# Patient Record
Sex: Female | Born: 1995 | Hispanic: No | Marital: Single | Smoking: Never smoker
Health system: Southern US, Community
[De-identification: ages and names within clinical notes are randomized; demographics above are authoritative.]

## PROBLEM LIST (undated history)

## (undated) DIAGNOSIS — F32A Depression, unspecified: Secondary | ICD-10-CM

## (undated) DIAGNOSIS — F419 Anxiety disorder, unspecified: Secondary | ICD-10-CM

---
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
## 2018-07-22 ENCOUNTER — Other Ambulatory Visit: Payer: Self-pay

## 2018-07-22 ENCOUNTER — Ambulatory Visit (INDEPENDENT_AMBULATORY_CARE_PROVIDER_SITE_OTHER): Payer: BC Managed Care – PPO | Admitting: Family Medicine

## 2018-07-22 VITALS — BP 108/60 | HR 62

## 2018-07-22 DIAGNOSIS — Z3009 Encounter for other general counseling and advice on contraception: Secondary | ICD-10-CM

## 2018-07-22 DIAGNOSIS — Z3042 Encounter for surveillance of injectable contraceptive: Secondary | ICD-10-CM

## 2018-07-22 LAB — POCT URINE PREGNANCY: Preg Test, Ur: NEGATIVE

## 2018-07-22 MED ORDER — MEDROXYPROGESTERONE ACETATE 150 MG/ML IM SUSP
150.0000 mg | Freq: Once | INTRAMUSCULAR | Status: AC
  Administered 2018-07-22: 150 mg via INTRAMUSCULAR

## 2018-07-27 NOTE — Progress Notes (Signed)
GYNECOLOGY CLINIC PROCEDURE NOTE  Emily Sellers is a 23 y.o. No obstetric history on file. here for Nexplanon removal.  Patient has no Paps and will be rescheduling to get this done.  Pregnancy test this visit is negative.  She wants the Nexplanon out because she wants to go to the Depo shot.  No other gynecologic concerns.   Nexplanon Removal Patient identified, informed consent performed, consent signed.   Appropriate time out taken. Nexplanon site identified.  Area prepped in usual sterile fashon. One ml of 1% lidocaine was used to anesthetize the area at the distal end of the implant. A small stab incision was made right beside the implant on the distal portion.  The Nexplanon rod was grasped using hemostats and removed without difficulty.  There was minimal blood loss. There were no complications.  3 ml of 1% lidocaine was injected around the incision for post-procedure analgesia.  Steri-strips were applied over the small incision.  A pressure bandage was applied to reduce any bruising.  The patient tolerated the procedure well and was given post procedure instructions.  Patient is planning to use Depo for contraception/attempt conception and her first injection was given in the clinic before leaving.  She will reschedule for Pap.Sherene Sires, DO

## 2018-07-28 ENCOUNTER — Telehealth: Payer: Self-pay | Admitting: *Deleted

## 2018-07-28 NOTE — Telephone Encounter (Signed)
-----   Message from Sherene Sires, DO sent at 07/27/2018  8:11 PM EDT ----- Please schedule patient for pap, any available doc is fine

## 2018-07-28 NOTE — Telephone Encounter (Signed)
Contacted pt to see about scheduling this and she stated that she was going to have this done at her next depo injection.  That should be around the first part of October. April Zimmerman Rumple, CMA

## 2018-10-09 ENCOUNTER — Ambulatory Visit: Payer: BC Managed Care – PPO

## 2018-10-16 ENCOUNTER — Ambulatory Visit (INDEPENDENT_AMBULATORY_CARE_PROVIDER_SITE_OTHER): Payer: BC Managed Care – PPO

## 2018-10-16 ENCOUNTER — Other Ambulatory Visit: Payer: Self-pay

## 2018-10-16 DIAGNOSIS — Z3042 Encounter for surveillance of injectable contraceptive: Secondary | ICD-10-CM | POA: Diagnosis not present

## 2018-10-16 MED ORDER — MEDROXYPROGESTERONE ACETATE 150 MG/ML IM SUSP
150.0000 mg | Freq: Once | INTRAMUSCULAR | Status: AC
  Administered 2018-10-16: 150 mg via INTRAMUSCULAR

## 2018-10-16 NOTE — Progress Notes (Signed)
Patient presents in nurse clinic for depo provera injection. Patient is within dates. Injection given RUOQ, site unremarkable. Patient to return for next injection, 01/01/2019-01/15/2019. Reminder card given.

## 2019-01-04 ENCOUNTER — Ambulatory Visit
Admission: EM | Admit: 2019-01-04 | Discharge: 2019-01-04 | Disposition: A | Discharge status: Home / Self Care | Payer: BC Managed Care – PPO | Attending: Emergency Medicine | Admitting: Emergency Medicine

## 2019-01-04 ENCOUNTER — Encounter: Payer: Self-pay | Admitting: Emergency Medicine

## 2019-01-04 ENCOUNTER — Other Ambulatory Visit: Payer: Self-pay

## 2019-01-04 DIAGNOSIS — J029 Acute pharyngitis, unspecified: Secondary | ICD-10-CM | POA: Insufficient documentation

## 2019-01-04 DIAGNOSIS — Z20822 Contact with and (suspected) exposure to covid-19: Secondary | ICD-10-CM

## 2019-01-04 LAB — POCT RAPID STREP A (OFFICE): Rapid Strep A Screen: NEGATIVE

## 2019-01-04 MED ORDER — LIDOCAINE VISCOUS HCL 2 % MT SOLN: 15.0000 mL | OROMUCOSAL | 0 refills | Status: DC | PRN

## 2019-01-04 NOTE — ED Triage Notes (Addendum)
Pt presents to [facility] for assessment of sore throat x 2 days.  Denies fevers.  C/o nasal congestion and cough previously, but it has gone away since then.  Denies n/v. States cousin and aunt were treated for strep recently.  Pt refusing COVID testing at this time.

## 2019-01-04 NOTE — ED Provider Notes (Signed)
[name]    CSN: [id number] Arrival date & time: 01/04/19  1156      History   Chief Complaint Chief Complaint  Patient presents with  . Sore Throat    HPI Emily Sellers is a 24 y.o. female presenting for 2-day course of sore throat.  Denies fever, myalgias, arthralgias, cough, shortness of breath.  Patient states she recently got over a URI, there is no concern for Covid as is her family "all I tested before we get together for Christmas ".  No other family members are sick, and all family members test were negative.  Patient does state her cousin or recently treated for strep.  Patient has not had anything for symptoms which are worse in the morning, with swallowing.   History reviewed. No pertinent past medical history.  Patient Active Problem List   Diagnosis Date Noted  . Nexplanon in place 06/26/2016  . Contraceptive management 03/23/2016    History reviewed. No pertinent surgical history.  OB History   No obstetric history on file.      Home Medications    Prior to Admission medications   Medication Sig Start Date End Date Taking? Authorizing Provider  ARIPiprazole (ABILIFY) 10 MG tablet Take 10 mg by mouth daily.   Yes [provider]  sertraline (ZOLOFT) 50 MG tablet Take 50 mg by mouth daily.   Yes [provider]  ibuprofen (ADVIL,MOTRIN) 600 MG tablet Take 1 tablet (600 mg total) by mouth every 6 (six) hours as needed. 10/23/15   Cheri Fowler, PA-C  lidocaine (XYLOCAINE) 2 % solution Use as directed 15 mLs in the mouth or throat as needed for mouth pain. 01/04/19   Hall-Potvin, [place], PA-C    Family History Family History  Problem Relation Age of Onset  . Healthy Mother   . Healthy Father     Social History Social History   Tobacco Use  . Smoking status: Never Smoker  . Smokeless tobacco: Never Used  Substance Use Topics  . Alcohol use: No  . Drug use: No     Allergies   Patient has no known  allergies.   Review of Systems Review of Systems  Constitutional: Negative for fatigue and fever.  HENT: Positive for sore throat. Negative for congestion, dental problem, ear pain, facial swelling, hearing loss, sinus pain, trouble swallowing and voice change.   Eyes: Negative for photophobia, pain and visual disturbance.  Respiratory: Negative for cough and shortness of breath.   Cardiovascular: Negative for chest pain and palpitations.  Gastrointestinal: Negative for diarrhea and vomiting.  Musculoskeletal: Negative for arthralgias and myalgias.  Neurological: Negative for dizziness and headaches.     Physical Exam Triage Vital Signs ED Triage Vitals  Enc Vitals Group     BP      Pulse      Resp      Temp      Temp src      SpO2      Weight      Height      Head Circumference      Peak Flow      Pain Score      Pain Loc      Pain Edu?      Excl. in GC?    No data found.  Updated Vital Signs BP 126/83 (BP Location: Left Arm)   Pulse 96   Temp 98.9 F (37.2 C) (Temporal)   Resp 16   LMP 10/15/2018  SpO2 98%   Visual Acuity Right Eye Distance:   Left Eye Distance:   Bilateral Distance:    Right Eye Near:   Left Eye Near:    Bilateral Near:     Physical Exam Constitutional:      General: She is not in acute distress.    Appearance: She is well-developed and normal weight. She is not toxic-appearing or diaphoretic.  HENT:     Head: Normocephalic and atraumatic.     Jaw: There is normal jaw occlusion. No tenderness or pain on movement.     Right Ear: Hearing, tympanic membrane, ear canal and external ear normal. No tenderness. No mastoid tenderness.     Left Ear: Hearing, tympanic membrane, ear canal and external ear normal. No tenderness. No mastoid tenderness.     Nose: No nasal deformity, septal deviation or nasal tenderness.     Right Turbinates: Not swollen or pale.     Left Turbinates: Not swollen or pale.     Right Sinus: No maxillary sinus  tenderness or frontal sinus tenderness.     Left Sinus: No maxillary sinus tenderness or frontal sinus tenderness.     Mouth/Throat:     Lips: Pink. No lesions.     Mouth: Mucous membranes are moist. No injury.     Pharynx: Oropharynx is clear. Uvula midline. No posterior oropharyngeal erythema or uvula swelling.     Tonsils: Tonsillar exudate present. No tonsillar abscesses. 2+ on the right. 2+ on the left.  Eyes:     Conjunctiva/sclera: Conjunctivae normal.     Pupils: Pupils are equal, round, and reactive to light.  Neck:     Comments: Tender left-sided cervical lymphadenopathy Cardiovascular:     Rate and Rhythm: Normal rate and regular rhythm.     Heart sounds: No murmur. No gallop.   Pulmonary:     Effort: Pulmonary effort is normal. No respiratory distress.     Breath sounds: No wheezing.  Musculoskeletal:     Cervical back: Normal range of motion and neck supple. No muscular tenderness.  Lymphadenopathy:     Cervical: Cervical adenopathy present.  Skin:    General: Skin is warm.     Capillary Refill: Capillary refill takes less than 2 seconds.     Findings: No erythema or rash.  Neurological:     Mental Status: She is alert and oriented to person, place, and time.      UC Treatments / Results  Labs (all labs ordered are listed, but only abnormal results are displayed) Labs Reviewed  POCT RAPID STREP A (OFFICE) - Normal  CULTURE, GROUP A STREP ([facility])  NOVEL CORONAVIRUS, NAA    EKG   Radiology No results found.  Procedures Procedures (including critical care time)  Medications Ordered in UC Medications - No data to display  Initial Impression / Assessment and Plan / UC Course  I have reviewed the triage vital signs and the nursing notes.  Pertinent labs & imaging results that were available during my care of the patient were reviewed by me and considered in my medical decision making (see chart for details).     Patient afebrile, nontoxic in office.   Rapid strep test negative-culture pending.  Covid PCR pending.  We will add lidocaine as adjuvant therapy to symptom management at this time as sore throat likely viral in etiology.  Return precautions discussed, patient verbalized understanding and is agreeable to plan. Final Clinical Impressions(s) / UC Diagnoses   Final diagnoses:  Sore  throat     Discharge Instructions     Your COVID test is pending - it is important to quarantine / isolate at home until your results are back. If you test positive and would like further evaluation for persistent or worsening symptoms, you may schedule an E-visit or virtual (video) visit throughout the [facility] app or website.  PLEASE NOTE: If you develop severe chest pain or shortness of breath please go to the ER or call 9-1-1 for further evaluation --> DO NOT schedule electronic or virtual visits for this. Please call our office for further guidance / recommendations as needed.    ED Prescriptions    Medication Sig Dispense Auth. Provider   lidocaine (XYLOCAINE) 2 % solution Use as directed 15 mLs in the mouth or throat as needed for mouth pain. 100 mL Hall-Potvin, [place], PA-C     PDMP not reviewed this encounter.   Hall-Potvin, [place], [place] 01/04/19 1735

## 2019-01-04 NOTE — Discharge Instructions (Addendum)
Your COVID test is pending - it is important to quarantine / isolate at home until your results are back. °If you test positive and would like further evaluation for persistent or worsening symptoms, you may schedule an E-visit or virtual (video) visit throughout the [facility] app or website. ° °PLEASE NOTE: If you develop severe chest pain or shortness of breath please go to the ER or call 9-1-1 for further evaluation --> DO NOT schedule electronic or virtual visits for this. °Please call our office for further guidance / recommendations as needed. °

## 2019-01-05 ENCOUNTER — Telehealth (INDEPENDENT_AMBULATORY_CARE_PROVIDER_SITE_OTHER): Payer: BC Managed Care – PPO | Admitting: Family Medicine

## 2019-01-05 ENCOUNTER — Encounter: Payer: Self-pay | Admitting: Family Medicine

## 2019-01-05 DIAGNOSIS — J029 Acute pharyngitis, unspecified: Secondary | ICD-10-CM

## 2019-01-05 LAB — NOVEL CORONAVIRUS, NAA: SARS-CoV-2, NAA: NOT DETECTED

## 2019-01-05 NOTE — Progress Notes (Signed)
[facility] Telemedicine Visit  Patient consented to have virtual visit. Method of visit: Video  Encounter participants: Patient: Emily Sellers - located at patient's home  Provider: Nicki Guadalajara - located at provider's home  Others (if applicable): none  Chief Complaint: sore throat   HPI:  Ms. Bracken reports having a sore throat for the past three days. She states that she had 2 family members test positive for strep throat in the past two weeks and that she has been in close proximity to both of these individuals.  She describes the left side of her throat as very painful, swollen with tender lymph nodes and reports that both she and her mother visualized copious amounts of white substances on the left side of her throat. She reports having pain with swallowing both liquids and solids and reports taking ibuprofen every 4-6 hours with minimial relief.  1 day prior to this visit, she reports visiting an urgent care center where she was evaluated and was rapid tested for strep throat which was negative. The patient was also tested for COVID-19 with negative results during this urgent care visit.   Upon review of systems, the patient denies fevers, chills, and sinus pressure/pain. She does endorse some sharp pains on the right side of her head that occur intermittently as well as some body aches. She adds that she recently recovered from a cold and still has a productive cough.     ROS: per HPI  Pertinent PMHx: last had strep throat 2-3 years ago, denies history of mononucleosis or known exposure  Exam:  Respiratory: speaking in full sentences clearly, does not show signs of SOB   Assessment/Plan:  Acute viral pharyngitis Given negative strep testing and Centor scoe of 2, patient has 11%-17% chance of having strep pharyngitis. Differential diagnosis includes mononucleosis however patient denies usual beahviors and interactions associated with this  diagnosis. Most likely diagnosis is viral etiology as patient recently had viral upper respiratory symptoms.  -patient counseled on maintaining hydration  -continue ibuprofen  -patient offered anti-tussive medication, declined at this time  -f/u appt arranged for 01/06/19  -patient given precautions for urgent care or ED if airway begins to feel compromised    Time spent during visit with patient: 23 minutes

## 2019-01-06 ENCOUNTER — Telehealth: Payer: Self-pay | Admitting: Family Medicine

## 2019-01-06 ENCOUNTER — Ambulatory Visit: Payer: BC Managed Care – PPO | Admitting: Family Medicine

## 2019-01-06 NOTE — Telephone Encounter (Signed)
The patient had a virtual visit yesterday for pharyngitis. She was recently seen at the urgent care, where she got a negative rapid strep test. Her strep culture is still pending. She had a negative COVID-19 test, as well. Since she already got a full work-up, there is no indication for an in-person visit today. I called to check on her. She said she was asked to come in to get her throat checked. I discussed the above information and that her visit today will not change anything since rapid strep was negative and the culture is pending. The patient stated that her mom has been checking her throat. Denies worsening of her symptoms. She opted to cancel her appointment and return as needed. I will contact her with her throat culture report. ED precautions discussed. She agreed with the plan and verbalized understanding.

## 2019-01-06 NOTE — Assessment & Plan Note (Addendum)
Given negative strep testing and Centor scoe of 2, patient has 11%-17% chance of having strep pharyngitis. Differential diagnosis includes mononucleosis however patient denies usual beahviors and interactions associated with this diagnosis. Most likely diagnosis is viral etiology as patient recently had viral upper respiratory symptoms.  -patient counseled on maintaining hydration  -continue ibuprofen  -patient offered anti-tussive medication, declined at this time  -f/u appt arranged for 01/06/19  -patient given precautions for urgent care or ED if airway begins to feel compromised

## 2019-01-06 NOTE — Telephone Encounter (Signed)
I understand. Thank you for checking calling to check on her.

## 2019-01-07 ENCOUNTER — Telehealth (HOSPITAL_COMMUNITY): Payer: Self-pay | Admitting: Emergency Medicine

## 2019-01-07 LAB — CULTURE, GROUP A STREP (THRC)

## 2019-01-07 NOTE — Telephone Encounter (Signed)
Strep result discussed. Non-Group A strep. No indication for treatment unless she is symptomatic. She said that her symptoms has improved and she opted for no A/B for now. Return precautions discussed.

## 2019-01-07 NOTE — Telephone Encounter (Signed)
Strep shows not group A strep, this normally is not treated and resolves on its own. However if not improving please call me at [phone] to discuss  Attempted to reach patient. No answer at this time.

## 2019-01-16 ENCOUNTER — Ambulatory Visit: Payer: BC Managed Care – PPO

## 2019-01-19 ENCOUNTER — Ambulatory Visit: Payer: BC Managed Care – PPO

## 2019-01-20 ENCOUNTER — Ambulatory Visit (INDEPENDENT_AMBULATORY_CARE_PROVIDER_SITE_OTHER): Payer: BC Managed Care – PPO

## 2019-01-20 ENCOUNTER — Other Ambulatory Visit: Payer: Self-pay

## 2019-01-20 DIAGNOSIS — Z3042 Encounter for surveillance of injectable contraceptive: Secondary | ICD-10-CM | POA: Diagnosis not present

## 2019-01-20 LAB — POCT URINE PREGNANCY: Preg Test, Ur: NEGATIVE

## 2019-01-20 MED ORDER — MEDROXYPROGESTERONE ACETATE 150 MG/ML IM SUSP
150.0000 mg | Freq: Once | INTRAMUSCULAR | Status: AC
Start: 2019-01-20 — End: 2019-01-20
  Administered 2019-01-20: 150 mg via INTRAMUSCULAR

## 2019-01-20 NOTE — Progress Notes (Signed)
Patient here today for Depo Provera injection and is not within her dates. Urine pregnancy test- negative Pt denies being sexually active in the last two week.   Depo given in LUOQ today.  Site unremarkable & patient tolerated injection.    Last contraceptive appt was 07/22/18  Next injection due April 6th- April 20th. .  Reminder card given.    Veronda Prude, RN

## 2019-04-07 ENCOUNTER — Other Ambulatory Visit: Payer: Self-pay

## 2019-04-07 ENCOUNTER — Ambulatory Visit (INDEPENDENT_AMBULATORY_CARE_PROVIDER_SITE_OTHER): Payer: BC Managed Care – PPO

## 2019-04-07 DIAGNOSIS — Z3042 Encounter for surveillance of injectable contraceptive: Secondary | ICD-10-CM | POA: Diagnosis not present

## 2019-04-07 MED ORDER — MEDROXYPROGESTERONE ACETATE 150 MG/ML IM SUSP
150.0000 mg | Freq: Once | INTRAMUSCULAR | Status: AC
  Administered 2019-04-07: 150 mg via INTRAMUSCULAR

## 2019-04-07 NOTE — Progress Notes (Signed)
Patient here today for Depo Provera injection and is within her dates.    Last contraceptive appt was July 21st, 2020  Depo given in RUOQ today.  Site unremarkable & patient tolerated injection.    Next injection due Jun 22nd - July 6th. Advised patient to schedule yearly visit to discuss birth control for next appointment.   Reminder card given.    Veronda Prude, RN

## 2019-06-23 ENCOUNTER — Other Ambulatory Visit: Payer: Self-pay

## 2019-06-23 ENCOUNTER — Ambulatory Visit (INDEPENDENT_AMBULATORY_CARE_PROVIDER_SITE_OTHER): Payer: BC Managed Care – PPO

## 2019-06-23 DIAGNOSIS — Z3042 Encounter for surveillance of injectable contraceptive: Secondary | ICD-10-CM

## 2019-06-23 MED ORDER — MEDROXYPROGESTERONE ACETATE 150 MG/ML IM SUSP
150.0000 mg | Freq: Once | INTRAMUSCULAR | Status: AC
  Administered 2019-06-23: 150 mg via INTRAMUSCULAR

## 2019-06-23 NOTE — Progress Notes (Signed)
Patient here today for Depo Provera injection and is within her dates.    Last contraceptive appt was 07/22/2018  Depo given in LUOQ today.  Site unremarkable & patient tolerated injection.    Next injection due 9/7-9/21.  Reminder card given.  Instructed patient to schedule office visit with new PCP, Dr. Nobie Putnam, for next depo injection.   Veronda Prude, RN

## 2019-09-15 ENCOUNTER — Encounter: Payer: Self-pay | Admitting: Family Medicine

## 2019-09-15 ENCOUNTER — Ambulatory Visit (INDEPENDENT_AMBULATORY_CARE_PROVIDER_SITE_OTHER): Payer: BC Managed Care – PPO | Admitting: Family Medicine

## 2019-09-15 ENCOUNTER — Other Ambulatory Visit: Payer: Self-pay

## 2019-09-15 ENCOUNTER — Ambulatory Visit: Payer: Self-pay

## 2019-09-15 VITALS — BP 112/82 | HR 97 | Ht 70.0 in | Wt 147.4 lb

## 2019-09-15 DIAGNOSIS — F339 Major depressive disorder, recurrent, unspecified: Secondary | ICD-10-CM | POA: Diagnosis not present

## 2019-09-15 DIAGNOSIS — Z3042 Encounter for surveillance of injectable contraceptive: Secondary | ICD-10-CM

## 2019-09-15 MED ORDER — MEDROXYPROGESTERONE ACETATE 150 MG/ML IM SUSP: 150.0000 mg | Freq: Once | INTRAMUSCULAR | Status: DC

## 2019-09-15 MED ORDER — MEDROXYPROGESTERONE ACETATE 150 MG/ML IM SUSP
150.0000 mg | Freq: Once | INTRAMUSCULAR | Status: AC
  Administered 2019-09-15: 150 mg via INTRAMUSCULAR

## 2019-09-15 NOTE — Assessment & Plan Note (Signed)
Patient reports history of depression for which she is prescribed Zoloft for.  She also reports he was prescribed Abilify for mood stabilizer.  She has not been taking these medications recently but would like to discuss either restarting these meds or switching to a different medication.  Patient denies SI or HI.  Visit scheduled for 3 weeks and patient reports she will start retaking the Zoloft until that time. -Follow-up with depression at next visit

## 2019-09-15 NOTE — Assessment & Plan Note (Signed)
Patient here to discuss contraception management.  She has been on Depo-Provera for approximately 4 years and has been doing well with it.  She was told by a friend that she should not be on it for too long so she had concerns.  We discussed the risk and benefits of continuing some form of contraceptive.  Patient is considering LARC but would like to continue with Depo at this time after our discussion. -Depo given at this visit -Follow-up appointment for other issues scheduled, patient needs Pap smear and consider at next visit -Patient will need redosing of Depo-Provera in approximately 3 months

## 2019-09-15 NOTE — Patient Instructions (Signed)
It was a pleasure to meet you today.  You appear to be doing well and I have no concerns at this time.  He received your dose of Depo-Provera today and should return for the next injection in approximately 3 months.  I hope you have a wondeful afternoon!   Hormonal Contraception Information Hormonal contraception is a type of birth control that uses hormones to prevent pregnancy. It usually involves a combination of the hormones estrogen and progesterone or only the hormone progesterone. Hormonal contraception works in these ways:  It thickens the mucus in the cervix, making it harder for sperm to enter the uterus.  It changes the lining of the uterus, making it harder for an egg to implant.  It may stop the ovaries from releasing eggs (ovulation). Some women who take hormonal contraceptives that contain only progesterone may continue to ovulate. Hormonal contraception cannot prevent sexually transmitted infections (STIs). Pregnancy may still occur. Estrogen and progesterone contraceptives Contraceptives that use a combination of estrogen and progesterone are available in these forms:  Pill. Pills come in different combinations of hormones. They must be taken at the same time each day. Pills can affect your period, causing you to get your period once every three months or not at all.  Patch. The patch must be worn on the lower abdomen for three weeks and then removed on the fourth.  Vaginal ring. The ring is placed in the vagina and left there for three weeks. It is then removed for one week. Progesterone contraceptives Contraceptives that use progesterone only are available in these forms:  Pill. Pills should be taken every day of the cycle.  Intrauterine device (IUD). This device is inserted into the uterus and removed or replaced every five years or sooner.  Implant. Plastic rods are placed under the skin of the upper arm. They are removed or replaced every three years or  sooner.  Injection. The injection is given once every 90 days. What are the side effects? The side effects of estrogen and progesterone contraceptives include:  Nausea.  Headaches.  Breast tenderness.  Bleeding or spotting between menstrual cycles.  High blood pressure (rare).  Strokes, heart attacks, or blood clots (rare) Side effects of progesterone-only contraceptives include:  Nausea.  Headaches.  Breast tenderness.  Unpredictable menstrual bleeding.  High blood pressure (rare). Talk to your health care provider about what side effects may affect you. Where to find more information  Ask your health care provider for more information and resources about hormonal contraception.  [place] Department of Health and Cytogeneticist on Women's Health: http://hoffman.com/ Questions to ask:  What type of hormonal contraception is right for me?  How long should I plan to use hormonal contraception?  What are the side effects of the hormonal contraception method I choose?  How can I prevent STIs while using hormonal contraception? Contact a health care provider if:  You start taking hormonal contraceptives and you develop persistent or severe side effects. Summary  Estrogen and progesterone are hormones used in many forms of birth control.  Talk to your health care provider about what side effects may affect you.  Hormonal contraception cannot prevent sexually transmitted infections (STIs).  Ask your health care provider for more information and resources about hormonal contraception. This information is not intended to replace advice given to you by your health care provider. Make sure you discuss any questions you have with your health care provider. Document Revised: 04/14/2018 Document Reviewed: 11/18/2015 Elsevier Patient Education  2020 Elsevier Inc.  

## 2019-09-15 NOTE — Progress Notes (Signed)
    SUBJECTIVE:   CHIEF COMPLAINT / HPI:    Patient presents for her annual visit for Depo.  Patient reports that she is considering stopping Depo because she was told by a friend that there may be adverse effects for being on Depo too long.  She reports that the Depo has been working really well for her and she cannot remember the last time she had menses.  She reports that she would continue it but just wanted to make sure it was safe for her to continue.  We discussed the risks and benefits of Depo including theoretical risk of osteoporosis with long-term use and she would like to continue with at this time.  Patient is sexually active and reports she uses condoms every time.   OBJECTIVE:   BP 112/82   Pulse 97   Ht 5\' 10"  (1.778 m)   Wt 147 lb 6.4 oz (66.9 kg)   SpO2 100%   BMI 21.15 kg/m   General: Well-appearing, no acute distress, sitting comfortably on exam table Cardiac: Regular rate and rhythm, no murmurs appreciated Respiratory: Normal work of breathing, lungs clear to auscultation bilaterally Abdomen: Soft, nontender, positive bowel sounds Derm: No rashes noted MSK: No joint tenderness noted, no edema noted Psych: Patient does report that she has issues with depression and has been feeling more down recently but denies SI or HI at this time.  Would like to schedule a visit to further discuss depression issues.  ASSESSMENT/PLAN:   Contraceptive management Patient here to discuss contraception management.  She has been on Depo-Provera for approximately 4 years and has been doing well with it.  She was told by a friend that she should not be on it for too long so she had concerns.  We discussed the risk and benefits of continuing some form of contraceptive.  Patient is considering LARC but would like to continue with Depo at this time after our discussion. -Depo given at this visit -Follow-up appointment for other issues scheduled, patient needs Pap smear and consider at  next visit -Patient will need redosing of Depo-Provera in approximately 3 months  Depression, recurrent (HCC) Patient reports history of depression for which she is prescribed Zoloft for.  She also reports he was prescribed Abilify for mood stabilizer.  She has not been taking these medications recently but would like to discuss either restarting these meds or switching to a different medication.  Patient denies SI or HI.  Visit scheduled for 3 weeks and patient reports she will start retaking the Zoloft until that time. -Follow-up with depression at next visit   , MD [facility]

## 2019-10-09 ENCOUNTER — Encounter: Payer: Self-pay | Admitting: Family Medicine

## 2019-10-09 ENCOUNTER — Ambulatory Visit (INDEPENDENT_AMBULATORY_CARE_PROVIDER_SITE_OTHER): Payer: BC Managed Care – PPO | Admitting: Family Medicine

## 2019-10-09 ENCOUNTER — Other Ambulatory Visit: Payer: Self-pay

## 2019-10-09 DIAGNOSIS — F339 Major depressive disorder, recurrent, unspecified: Secondary | ICD-10-CM

## 2019-10-09 MED ORDER — SERTRALINE HCL 50 MG PO TABS: 50.0000 mg | ORAL_TABLET | Freq: Every day | ORAL | 0 refills | Status: DC

## 2019-10-09 NOTE — Assessment & Plan Note (Signed)
Patient reports her depression is slightly worse than before but that she did not start taking her Zoloft because her prescription was out of date.  She is also on Abilify for mood stabilization but she is not taking that either.  She reports that one of the medications made her feel anxious but she is not sure which one.  She would like for me to refill the Zoloft so she has an updated prescription.  Denies any SI or HI.  PHQ-9 today was 15 with a 1 on question 9.  The 1 was her questioning if anyone would miss her if she were gone. -Prescription for Zoloft sent to pharmacy -Patient given resources for psychology and psychiatry -Strict return precautions given -Follow-up in 1 month or sooner if necessary

## 2019-10-09 NOTE — Progress Notes (Signed)
    SUBJECTIVE:   CHIEF COMPLAINT / HPI:   Depression  Patient reports that her she feels her depression has been getting worse since her previous visit.  We discussed her starting her medication back at her previous visit but she reports that her medications were expired so she did not start taking them.  She is in college and taking 3 classes in person and for classes online.  She completes most of her work in her room and says that she sleeps a lot.  She will get out of class at around 4 PM and sleep until 9 PM, get up and have a snack and then go back to sleep.  She then has difficulty sleeping throughout the night.  She does have 2 good friends which she reports are social support.  She also has a good relationship with her parents although she says that she sometimes questions if they care about her.  She denies any thoughts of harming herself but reports that she thinks "what anybody messing if I was gone".  She has no plans of harming herself.  We discussed the bedroom should be for sleep only.  And I recommended she do her work in places other than her bedroom.  I also recommended she get out of the house and do things like take her dog for a walk as well as exercise.  She reports that all of these sound like wonderful ideas and she will try them.  OBJECTIVE:   BP 128/78   Pulse 80   Ht 5\' 10"  (1.778 m)   Wt 146 lb (66.2 kg)   BMI 20.95 kg/m   General: Well-appearing, no acute distress, sitting comfortably in chair next to exam table Cardiac: Regular rate and rhythm, no murmurs appreciated Respiratory: Normal work of breathing, lungs clear to auscultation bilaterally Psych: Normal affect, good mood, denies SI or HI.  Reports that she sometimes wonders "would anyone miss me if I were gone".  ASSESSMENT/PLAN:   Depression, recurrent (HCC) Patient reports her depression is slightly worse than before but that she did not start taking her Zoloft because her prescription was out of date.   She is also on Abilify for mood stabilization but she is not taking that either.  She reports that one of the medications made her feel anxious but she is not sure which one.  She would like for me to refill the Zoloft so she has an updated prescription.  Denies any SI or HI.  PHQ-9 today was 15 with a 1 on question 9.  The 1 was her questioning if anyone would miss her if she were gone. -Prescription for Zoloft sent to pharmacy -Patient given resources for psychology and psychiatry -Strict return precautions given -Follow-up in 1 month or sooner if necessary   , MD [facility]

## 2019-10-09 NOTE — Patient Instructions (Signed)
It was a pleasure to see you today!  I am sorry your medications were expired but I have sent a refill to your pharmacy.  I would like for you to try these for 1 month and follow-up with me.  I have scheduled that appointment for 11/8 at 1:30 PM.  I have attached some psychiatry and psychology resources to this document.  If you have any questions, concerns or need anything please feel free to reach out.  I hope you have a wonderful afternoon!   Therapy and Counseling Resources Most providers on this list will take Medicaid. Patients with commercial insurance or Medicare should contact their insurance company to get a list of in network providers.  [facility]Psychiatry and Counseling [address] [place], [place] [address] [phone]  [facility]  [address], Suite [place], [place] [address]      [phone]  [facility] [address]  [place], [place] [address] [phone]  [facility] [address], [address]  [place], [place] [address]       [phone]      [facility]ov[facility] Kussma[facility] [address] [place], [place] [phone]  [facility]:  [address] [place] [phone]  [facility]:  [address] [place] Diener [phone]  [facility] (under & uninsured) [address]   [place] [phone]    [email or web address]     [facility] [address] . [place]    [phone]  [facility] -[address]     Phone:  [phone]     [facility]-  [address]  [phone]   [facility] [address]      [place], [place] [phone]  [facility]: [address] [place], [place], [place]  [phone]   [facility] (Spanish therapist) [email or web address]  [address] [place]  [address]    [place] [address]    [phone]    [facility]   [address],  [place], [place]  [phone]   [facility]  [address] [place]  [phone]  [facility]  [address] [place], [place]        [phone]  Open Access/Walk In Clinic under & uninsured  [facility]  [address] [place], [place] [phone] Crisis [phone]  [facility] [place],  (Spanish)   [address], [place]: [phone]) 8:30 - 12; 1 - 2:30  [facility] [employer],  [address] [place], [place]    [phone]):8:30 - 12; 2 - 3PM  [facility] [employer],  [address]  [place]; [phone]):   Mon - Fri 8 AM - 5 PM  [facility] [address] [place]  MWF 12:30 to 3:00 or call to schedule an appointment  [phone]  Specific Provider options Psychology Today  [email or web address] 1. click on find a therapist  2. enter your zip code 3. left side and select or tailor a therapist for your specific need.   [facility] Provider Directory [email or web address]  (Medicaid)   Follow all drop down to find a provider  Social Support program [facility] [place] [phone] or PhotoSolver.pl [address], [place], [place] Recovery support and educational  24- Hour Availability:  .  [facility]  . [place] Crisis [phone]  . [facility] [employer] [phone]  [facility] Service  [phone]   . [place]  [phone] (after hours)  . [facility]/Mobile Crisis   [phone]  . [place] National Suicide Hotline  978-195-9326 ([place])  . Call 911 or go to emergency room  . [employer]  [phone]);  [place] and [employer]    . [facility]  [phone]); [place], [place], [place], [place], [place], [place], [place]

## 2019-11-09 ENCOUNTER — Ambulatory Visit (INDEPENDENT_AMBULATORY_CARE_PROVIDER_SITE_OTHER): Payer: BC Managed Care – PPO | Admitting: Family Medicine

## 2019-11-09 ENCOUNTER — Other Ambulatory Visit: Payer: Self-pay

## 2019-11-09 ENCOUNTER — Encounter: Payer: Self-pay | Admitting: Family Medicine

## 2019-11-09 VITALS — BP 122/74 | HR 74 | Ht 70.0 in | Wt 145.6 lb

## 2019-11-09 DIAGNOSIS — F339 Major depressive disorder, recurrent, unspecified: Secondary | ICD-10-CM | POA: Diagnosis not present

## 2019-11-09 MED ORDER — FLUOXETINE HCL 20 MG PO TABS: 20.0000 mg | ORAL_TABLET | Freq: Every day | ORAL | 1 refills | Status: DC

## 2019-11-09 NOTE — Progress Notes (Signed)
    SUBJECTIVE:   CHIEF COMPLAINT / HPI:   Depression follow-up She reports that her depression is doing better since she restarted on her medications but she has had multiple anxiety attacks and she feels it may be a side effect of the medication.  This was the reason she previously stopped it.  She reports having for anxiety attacks over the last month with the most recent 1 being relatively severe.  She is interested in discontinuing the current medication switching to a different antidepressant medication.  When asked about mania symptoms patient reports she sometimes feels like she has a lot of energy but does not stay up for days on and and has not had issues falling asleep.  She is not had thoughts of grandiosity.  PERTINENT  PMH / PSH: Depression  OBJECTIVE:   BP 122/74   Pulse 74   Ht 5\' 10"  (1.778 m)   Wt 145 lb 9.6 oz (66 kg)   SpO2 99%   BMI 20.89 kg/m   General: Well-appearing 24 year old female, no acute distress Cardiac: Regular rate and rhythm, no murmurs appreciated respiratory: Normal work of breathing, lungs clear to auscultation bilaterally Psych: Denies SI/HI.  Reports that depression is doing better but she has having some issues with anxiety.  ASSESSMENT/PLAN:   Depression, recurrent (HCC) Patient reports that she has felt improvement in her depression since starting her medications back but has been having issues with anxiety.  She is not taking her Abilify.  PHQ-9 today 12 compared to 15 at previous visit.  0 on question 9.  Is interested in switching to a different medication. -Discontinued Zoloft -Started patient on Prozac 20 mg daily -Patient already has information for psychology and psychiatry -Strict return precautions given -We will follow-up in 1 month or sooner if needed     21, MD [facility]

## 2019-11-09 NOTE — Assessment & Plan Note (Signed)
Patient reports that she has felt improvement in her depression since starting her medications back but has been having issues with anxiety.  She is not taking her Abilify.  PHQ-9 today 12 compared to 15 at previous visit.  0 on question 9.  Is interested in switching to a different medication. -Discontinued Zoloft -Started patient on Prozac 20 mg daily -Patient already has information for psychology and psychiatry -Strict return precautions given -We will follow-up in 1 month or sooner if needed

## 2019-11-09 NOTE — Patient Instructions (Addendum)
It was great seeing you today.  I am glad your depression is doing better I am sorry you are having issues with anxiety.  This may be a negative side effect from the Zoloft.  I would like to switch you to a different SSRI and let us see how you tolerate it.  I am starting you on Prozac 20 mg daily.  Please stop taking the Zoloft and you can immediately switch to the Prozac.  If you have any issues, questions, concerns please feel free to follow-up with our clinic.  I would like to see you in 1 month.  Could you please bring records from your previous psychiatrist if possible.  Thank you so much and I hope you have a wonderful afternoon!  Fluoxetine capsules or tablets (Depression/Mood Disorders) What is this medicine? FLUOXETINE (floo OX e teen) belongs to a class of drugs known as selective serotonin reuptake inhibitors (SSRIs). It helps to treat mood problems such as depression, obsessive compulsive disorder, and panic attacks. It can also treat certain eating disorders. This medicine may be used for other purposes; ask your health care provider or pharmacist if you have questions. COMMON BRAND NAME(S): Prozac What should I tell my health care provider before I take this medicine? They need to know if you have any of these conditions:  bipolar disorder or a family history of bipolar disorder  bleeding disorders  glaucoma  heart disease  liver disease  low levels of sodium in the blood  seizures  suicidal thoughts, plans, or attempt; a previous suicide attempt by you or a family member  take MAOIs like Carbex, Eldepryl, Marplan, Nardil, and Parnate  take medicines that treat or prevent blood clots  thyroid disease  an unusual or allergic reaction to fluoxetine, other medicines, foods, dyes, or preservatives  pregnant or trying to get pregnant  breast-feeding How should I use this medicine? Take this medicine by mouth with a glass of water. Follow the directions on the  prescription label. You can take this medicine with or without food. Take your medicine at regular intervals. Do not take it more often than directed. Do not stop taking this medicine suddenly except upon the advice of your doctor. Stopping this medicine too quickly may cause serious side effects or your condition may worsen. A special MedGuide will be given to you by the pharmacist with each prescription and refill. Be sure to read this information carefully each time. Talk to your pediatrician regarding the use of this medicine in children. While this drug may be prescribed for children as young as 7 years for selected conditions, precautions do apply. Overdosage: If you think you have taken too much of this medicine contact a poison control center or emergency room at once. NOTE: This medicine is only for you. Do not share this medicine with others. What if I miss a dose? If you miss a dose, skip the missed dose and go back to your regular dosing schedule. Do not take double or extra doses. What may interact with this medicine? Do not take this medicine with any of the following medications:  other medicines containing fluoxetine, like Sarafem or Symbyax  cisapride  dronedarone  linezolid  MAOIs like Carbex, Eldepryl, Marplan, Nardil, and Parnate  methylene blue (injected into a vein)  pimozide  thioridazine This medicine may also interact with the following medications:  alcohol  amphetamines  aspirin and aspirin-like medicines  carbamazepine  certain medicines for depression, anxiety, or psychotic disturbances  certain  medicines for migraine headaches like almotriptan, eletriptan, frovatriptan, naratriptan, rizatriptan, sumatriptan, zolmitriptan  digoxin  diuretics  fentanyl  flecainide  furazolidone  isoniazid  lithium  medicines for sleep  medicines that treat or prevent blood clots like warfarin, enoxaparin, and dalteparin  NSAIDs, medicines for pain  and inflammation, like ibuprofen or naproxen  other medicines that prolong the QT interval (an abnormal heart rhythm)  phenytoin  procarbazine  propafenone  rasagiline  ritonavir  supplements like [place] Kreps's wort, kava kava, valerian  tramadol  tryptophan  vinblastine This list may not describe all possible interactions. Give your health care provider a list of all the medicines, herbs, non-prescription drugs, or dietary supplements you use. Also tell them if you smoke, drink alcohol, or use illegal drugs. Some items may interact with your medicine. What should I watch for while using this medicine? Tell your doctor if your symptoms do not get better or if they get worse. Visit your doctor or health care professional for regular checks on your progress. Because it may take several weeks to see the full effects of this medicine, it is important to continue your treatment as prescribed by your doctor. Patients and their families should watch out for new or worsening thoughts of suicide or depression. Also watch out for sudden changes in feelings such as feeling anxious, agitated, panicky, irritable, hostile, aggressive, impulsive, severely restless, overly excited and hyperactive, or not being able to sleep. If this happens, especially at the beginning of treatment or after a change in dose, call your health care professional. Bonita Quin may get drowsy or dizzy. Do not drive, use machinery, or do anything that needs mental alertness until you know how this medicine affects you. Do not stand or sit up quickly, especially if you are an older patient. This reduces the risk of dizzy or fainting spells. Alcohol may interfere with the effect of this medicine. Avoid alcoholic drinks. Your mouth may get dry. Chewing sugarless gum or sucking hard candy, and drinking plenty of water may help. Contact your doctor if the problem does not go away or is severe. This medicine may affect blood sugar levels. If you  have diabetes, check with your doctor or health care professional before you change your diet or the dose of your diabetic medicine. What side effects may I notice from receiving this medicine? Side effects that you should report to your doctor or health care professional as soon as possible:  allergic reactions like skin rash, itching or hives, swelling of the face, lips, or tongue  anxious  black, tarry stools  breathing problems  changes in vision  confusion  elevated mood, decreased need for sleep, racing thoughts, impulsive behavior  eye pain  fast, irregular heartbeat  feeling faint or lightheaded, falls  feeling agitated, angry, or irritable  hallucination, loss of contact with reality  loss of balance or coordination  loss of memory  painful or prolonged erections  restlessness, pacing, inability to keep still  seizures  stiff muscles  suicidal thoughts or other mood changes  trouble sleeping  unusual bleeding or bruising  unusually weak or tired  vomiting Side effects that usually do not require medical attention (report to your doctor or health care professional if they continue or are bothersome):  change in appetite or weight  change in sex drive or performance  diarrhea  dry mouth  headache  increased sweating  nausea  tremors This list may not describe all possible side effects. Call your doctor for medical  advice about side effects. You may report side effects to FDA at 1-800-FDA-1088. Where should I keep my medicine? Keep out of the reach of children. Store at room temperature between 15 and 30 degrees C (59 and 86 degrees F). Throw away any unused medicine after the expiration date. NOTE: This sheet is a summary. It may not cover all possible information. If you have questions about this medicine, talk to your doctor, pharmacist, or health care provider.  2020 Elsevier/Gold Standard (2017-08-08 11:56:53)

## 2019-12-03 ENCOUNTER — Other Ambulatory Visit: Payer: Self-pay

## 2019-12-03 ENCOUNTER — Ambulatory Visit (INDEPENDENT_AMBULATORY_CARE_PROVIDER_SITE_OTHER): Payer: BC Managed Care – PPO

## 2019-12-03 DIAGNOSIS — Z3042 Encounter for surveillance of injectable contraceptive: Secondary | ICD-10-CM

## 2019-12-03 MED ORDER — MEDROXYPROGESTERONE ACETATE 150 MG/ML IM SUSP
150.0000 mg | Freq: Once | INTRAMUSCULAR | Status: AC
  Administered 2019-12-03: 150 mg via INTRAMUSCULAR

## 2019-12-03 NOTE — Progress Notes (Signed)
Patient here today for Depo Provera injection and is within her dates.    Last contraceptive appt was 09/2019.  Depo given in RUOQ today. Site unremarkable & patient tolerated injection.    Next injection due 02/18/2020-03/03/2020. Reminder card given.

## 2019-12-08 ENCOUNTER — Other Ambulatory Visit: Payer: Self-pay

## 2019-12-08 ENCOUNTER — Ambulatory Visit (INDEPENDENT_AMBULATORY_CARE_PROVIDER_SITE_OTHER): Payer: BC Managed Care – PPO | Admitting: Family Medicine

## 2019-12-08 ENCOUNTER — Encounter: Payer: Self-pay | Admitting: Family Medicine

## 2019-12-08 DIAGNOSIS — F339 Major depressive disorder, recurrent, unspecified: Secondary | ICD-10-CM

## 2019-12-08 NOTE — Assessment & Plan Note (Signed)
PHQ-9 score of 6, much improved from 12 one month ago. Patient very happy with where she is now. No SI/HI. Discussed uncertain period after college graduation and she is aware that this may be a difficult time emotionally. Encouraged her to reach out if there is anything we can help with.  - Continue Prozac 20mg  daily.  - Patient aware of psychology/psychiatry resources.

## 2019-12-08 NOTE — Progress Notes (Addendum)
    SUBJECTIVE:   CHIEF COMPLAINT / HPI:  Depression Emily Sellers reports feeling "so much better" after four weeks on Prozac 20mg  daily. Had one episode of anxiety shortly after switching to Prozac, but none in the past three weeks. She notes that she will be graduating college in two days and points to this as a possible source of stress in the coming days. She reports some irregular sleep and eating habits in the past few weeks but attributes these to late nights spent studying and finishing papers for school. She expects this to improve upon graduation. Uncertain what her plans are after college.  Denies any SI/HI.   PERTINENT  PMH / PSH: Depression  OBJECTIVE:   BP 106/62   Pulse (!) 105   Wt 148 lb 6.4 oz (67.3 kg)   SpO2 98%   BMI 21.29 kg/m   Smiling and conversant. Mood and affect appropriate, non-anxious, non-depressed.  PHQ-9 score of 6. Question 9 negative.   PHQ9 SCORE ONLY 12/08/2019 11/09/2019 10/09/2019  PHQ-9 Total Score 6 12 15     ASSESSMENT/PLAN:   Depression, recurrent (HCC) PHQ-9 score of 6, much improved from 12 one month ago. Patient very happy with where she is now. No SI/HI. Discussed uncertain period after college graduation and she is aware that this may be a difficult time emotionally. Encouraged her to reach out if there is anything we can help with.  - Continue Prozac 20mg  daily.  - Patient aware of psychology/psychiatry resources.      12/09/2019, Medical Student [facility]   Resident Attestation  I saw and evaluated the patient, performing the key elements of the service.I  personally performed or re-performed the history, physical exam, and medical decision making activities of this service and have verified that the service and findings are accurately documented in the student's note. I developed the management plan that is described in the medical student's note, and I agree with the content, with my edits above.     , PGY2

## 2019-12-08 NOTE — Patient Instructions (Signed)
It was great to see you today!  I am glad you are doing so well and I do not want to change any of your medications given how well you are doing.  If you have any issues or need anything please feel free to reach out.  I think we can follow-up in 6 months or so regarding how you are doing and if we need to make any further changes.  I happy have a Alt[place]e [place]al Christmas!

## 2020-01-15 ENCOUNTER — Other Ambulatory Visit: Payer: Self-pay

## 2020-01-15 ENCOUNTER — Ambulatory Visit (INDEPENDENT_AMBULATORY_CARE_PROVIDER_SITE_OTHER): Payer: BC Managed Care – PPO | Admitting: Family Medicine

## 2020-01-15 ENCOUNTER — Encounter: Payer: Self-pay | Admitting: Family Medicine

## 2020-01-15 DIAGNOSIS — H5711 Ocular pain, right eye: Secondary | ICD-10-CM | POA: Insufficient documentation

## 2020-01-15 MED ORDER — FLUOXETINE HCL 20 MG PO TABS: 20.0000 mg | ORAL_TABLET | Freq: Every day | ORAL | 1 refills | Status: DC

## 2020-01-15 NOTE — Progress Notes (Signed)
    SUBJECTIVE:   CHIEF COMPLAINT / HPI:   Eye pressure/pain Patient reports that she has been having issues with this for the past 6 to 7 months.  She reports that once a month at irregular times in the month she will have a sharp pain in her right eye with a little blurry vision that then radiates to her temple.  The pain last for approximately 3 minutes and then spontaneously resolves.  Her vision returns to normal and this does not occur again for approximately another month.  She does not have time to take any medications for it and cannot identify any triggers for this.  OBJECTIVE:   BP 110/70   Pulse (!) 113   Ht 5\' 10"  (1.778 m)   Wt 146 lb (66.2 kg)   SpO2 98%   BMI 20.95 kg/m   General: Well-appearing 25 year old female, no acute distress HEENT: Pupils equal and reactive to light, normal funduscopic exam extraocular eye movement intact without pain, vision screen showed 20/20 in right and left eyes Cardiac: Mildly tachycardic, no murmurs appreciated Respiratory: Normal work of breathing, lungs clear to auscultation bilaterally Neuro: Cranial nerves intact with no focal neurologic deficits   ASSESSMENT/PLAN:   Eye pain, right Patient with 26-month history of infrequent sudden eye pain which radiates to her temple and then resolves after approximately 3 minutes.  Patient reports she has blurriness when this occurs but then the blurriness resolves as well.  Unclear etiology at this time.  Differential includes cluster headaches although this does not quite fit given it only occurs once a month and resolved so quickly.  Ophthalmologic exam was reassuring but could consider ophthalmologic exam with dilation by an ophthalmologist if this continues.  Discussed this with the patient and she reports that she may go see an ophthalmologist.  Strict return precautions given     9-month, MD [facility]

## 2020-01-15 NOTE — Assessment & Plan Note (Signed)
Patient with 28-month history of infrequent sudden eye pain which radiates to her temple and then resolves after approximately 3 minutes.  Patient reports she has blurriness when this occurs but then the blurriness resolves as well.  Unclear etiology at this time.  Differential includes cluster headaches although this does not quite fit given it only occurs once a month and resolved so quickly.  Ophthalmologic exam was reassuring but could consider ophthalmologic exam with dilation by an ophthalmologist if this continues.  Discussed this with the patient and she reports that she may go see an ophthalmologist.  Strict return precautions given

## 2020-01-15 NOTE — Patient Instructions (Signed)
It was great seeing you today.  I am not sure of the cause of your eye pain but it could be a cluster headache.  They usually occur more frequently than what you are describing.  Given that it resolves fast enough where you cannot take any treatment I have no recommendations for treatment at this time.  If you are still concerned you could go see an eye doctor who can dilate your eye and get a better view of the inside of your eye.  If you have any worsening symptoms, questions, concerns please feel free to call the clinic.  I hope you have a wonderful afternoon!   Cluster Headache Cluster headaches hurt a lot. They normally happen on one side of your head, but they may switch sides. Often, cluster headaches:  Cause a lot of pain.  Happen for weeks to months.  Last from 15 minutes to 3 hours.  Happen at the same time each day.  Happen at night.  Happen many times a day.  Happen more often in the fall and springtime. What are the causes? The exact cause is not known. They are not usually caused by foods, changes in body chemicals (hormonal changes), or stress. What increases the risk?  Being a female between the ages of 47-51 years old.  Smoking or using products that contain nicotine or tobacco.  Having elevated levels of body chemical called histamine. This can happen in people who have allergies.  Taking certain medicines that cause blood vessels to expand.  Having a parent or brother or sister who has cluster headaches. What are the signs or symptoms?  Very bad pain on one side of the head that begins behind or around your eye but may spread to your face, head, and neck.  Feeling like you may vomit (nauseous).  Being sensitive to light.  Runny nose and stuffy nose.  Swelling of the forehead or face on the affected side.  Eye problems. This might include a droopy or swollen eyelid, eye redness, or tearing on the affected side.  Feeling restless or upset.  Pale skin or  a flushed face. How is this treated?  Medicines.  Oxygen that is breathed in through a mask. Follow these instructions at home: Headache diary Keep a headache diary as told by your doctor. Doing this can help you and your doctor figure out what triggers your headaches. In your headache diary, include information about:  The time of day that your headache started and what you were doing when it began.  How long your headache lasted.  Where your pain started and whether it moved to other areas.  The type of pain.  Your level of pain. Use a pain scale and rate the pain with a number from 1 (mild) up to 10 (very bad).  The treatment that you used, and any change in symptoms after treatment.   Medicines  Take over-the-counter and prescription medicines only as told by your health care provider.  Ask your doctor if the medicine prescribed to you: ? Requires you to avoid driving or using machinery. ? Can cause trouble pooping (constipation). You may need to take these actions to prevent or treat trouble pooping:  Drink enough fluid to keep your pee (urine) pale yellow.  Take over-the-counter or prescription medicines.  Eat foods that are high in fiber. These include beans, whole grains, and fresh fruits and vegetables.  Limit foods that are high in fat and processed sugars. These include fried or  sweet foods. Lifestyle  Go to bed at the same time each night. Get the same amount of sleep every night. Get 7-9 hours of sleep each night, or the amount recommended by your doctor.  Limit or manage stress.  Exercise regularly. Exercise for at least 30 minutes, 5 times each week.  Eat a healthy diet. Avoid any foods that you know may trigger your headaches.  Do not drink alcohol.  Do not use any products that contain nicotine or tobacco, such as cigarettes, e-cigarettes, and chewing tobacco. If you need help quitting, ask your doctor.   General instructions  Use oxygen as told by  your doctor.  Keep all follow-up visits as told by your health care provider. This is important. Contact a doctor if:  Your headaches: ? Change. ? Get worse. ? Happen more often.  Your medicines or oxygen are not helping. Get help right away if:  You faint.  You get weak or lose feeling (have numbness) on one side of your body or face.  You see two of everything (double vision).  You feel you may vomit or you vomit and it does stop after many hours.  You have trouble with your balance or with walking.  You have trouble talking.  You have neck pain or stiffness and you have a fever. Summary  Cluster headaches hurt a lot.  Keep a headache diary.  Do not drink alcohol.  Medicines and oxygen may help you feel better. This information is not intended to replace advice given to you by your health care provider. Make sure you discuss any questions you have with your health care provider. Document Revised: 08/21/2019 Document Reviewed: 01/22/2019 Elsevier Patient Education  2021 [employer].

## 2020-02-18 ENCOUNTER — Ambulatory Visit (INDEPENDENT_AMBULATORY_CARE_PROVIDER_SITE_OTHER): Payer: BC Managed Care – PPO

## 2020-02-18 ENCOUNTER — Other Ambulatory Visit: Payer: Self-pay

## 2020-02-18 DIAGNOSIS — Z3042 Encounter for surveillance of injectable contraceptive: Secondary | ICD-10-CM | POA: Diagnosis not present

## 2020-02-19 MED ORDER — MEDROXYPROGESTERONE ACETATE 150 MG/ML IM SUSP
150.0000 mg | Freq: Once | INTRAMUSCULAR | Status: AC
  Administered 2020-02-18: 150 mg via INTRAMUSCULAR

## 2020-02-19 NOTE — Progress Notes (Signed)
Patient here today for Depo Provera injection and is within her dates.    Last contraceptive appt was 09/2019  Depo given in LUOQ today.  Site unremarkable & patient tolerated injection.    Next injection due 5/5-5/19.  Reminder card given.    Veronda Prude, RN

## 2020-03-20 ENCOUNTER — Ambulatory Visit
Admission: EM | Admit: 2020-03-20 | Discharge: 2020-03-20 | Discharge status: Against Medical Advice | Payer: BC Managed Care – PPO

## 2020-03-20 ENCOUNTER — Ambulatory Visit
Admission: EM | Admit: 2020-03-20 | Discharge: 2020-03-20 | Disposition: A | Discharge status: Home / Self Care | Payer: BC Managed Care – PPO

## 2020-03-20 ENCOUNTER — Other Ambulatory Visit: Payer: Self-pay

## 2020-03-20 ENCOUNTER — Encounter: Payer: Self-pay | Admitting: *Deleted

## 2020-03-20 DIAGNOSIS — M546 Pain in thoracic spine: Secondary | ICD-10-CM

## 2020-03-20 HISTORY — DX: Depression, unspecified: F32.A

## 2020-03-20 HISTORY — DX: Anxiety disorder, unspecified: F41.9

## 2020-03-20 MED ORDER — TIZANIDINE HCL 4 MG PO TABS: 2.0000 mg | ORAL_TABLET | Freq: Four times a day (QID) | ORAL | 0 refills | Status: DC | PRN

## 2020-03-20 MED ORDER — IBUPROFEN 800 MG PO TABS: 800.0000 mg | ORAL_TABLET | Freq: Three times a day (TID) | ORAL | 0 refills | Status: DC

## 2020-03-20 NOTE — ED Provider Notes (Signed)
[name]    CSN: [id number] Arrival date & time: 03/20/20  1313      History   Chief Complaint Chief Complaint  Patient presents with  . Back Pain    HPI Emily Sellers is a 25 y.o. female presenting today for evaluation of back pain.  Woke up with discomfort in her right upper back.  Denies specific injury or trauma.  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  Denies any specific injury triggering symptoms.  Symptoms mainly right-sided.  Denies any difficulty breathing or shortness of breath.  Denies any radiation into extremities.  Denies numbness or tingling.  HPI  Past Medical History:  Diagnosis Date  . Anxiety   . Depression     Patient Active Problem List   Diagnosis Date Noted  . Eye pain, right 01/15/2020  . Depression, recurrent (HCC) 09/15/2019  . Nexplanon in place 06/26/2016  . Contraceptive management 03/23/2016  . Acute viral pharyngitis 10/24/2015    History reviewed. No pertinent surgical history.  OB History   No obstetric history on file.      Home Medications    Prior to Admission medications   Medication Sig Start Date End Date Taking? Authorizing Provider  FLUoxetine (PROZAC) 20 MG tablet Take 1 tablet (20 mg total) by mouth daily. 01/15/20  Yes Derrel Nip, MD  ibuprofen (ADVIL) 800 MG tablet Take 1 tablet (800 mg total) by mouth 3 (three) times daily. 03/20/20  Yes Tiara Bartoli C, PA-C  medroxyPROGESTERone Acetate (DEPO-PROVERA IM) Inject into the muscle.   Yes [provider]  tiZANidine (ZANAFLEX) 4 MG tablet Take 0.5-1 tablets (2-4 mg total) by mouth every 6 (six) hours as needed for muscle spasms. 03/20/20  Yes Chantea Surace C, PA-C  ARIPiprazole (ABILIFY) 10 MG tablet Take 10 mg by mouth daily. Patient not taking: Reported on 09/15/2019  03/20/20  [provider]    Family History Family History  Problem Relation Age of Onset  . Healthy Mother   . Healthy  Father     Social History Social History   Tobacco Use  . Smoking status: Never Smoker  . Smokeless tobacco: Never Used  Vaping Use  . Vaping Use: Never used  Substance Use Topics  . Alcohol use: Yes    Comment: occasionally  . Drug use: Yes    Types: Marijuana     Allergies   Patient has no known allergies.   Review of Systems Review of Systems  Constitutional: Negative for fatigue and fever.  Eyes: Negative for visual disturbance.  Respiratory: Negative for shortness of breath.   Cardiovascular: Negative for chest pain.  Gastrointestinal: Negative for abdominal pain, nausea and vomiting.  Musculoskeletal: Positive for back pain and myalgias. Negative for arthralgias and joint swelling.  Skin: Negative for color change, rash and wound.  Neurological: Negative for dizziness, weakness, light-headedness and headaches.     Physical Exam Triage Vital Signs ED Triage Vitals  Enc Vitals Group     BP      Pulse      Resp      Temp      Temp src      SpO2      Weight      Height      Head Circumference      Peak Flow      Pain Score      Pain Loc      Pain Edu?  Excl. in GC?    No data found.  Updated Vital Signs BP 116/76   Pulse 73   Temp 98.2 F (36.8 C) (Oral)   Resp 16   SpO2 99%   Visual Acuity Right Eye Distance:   Left Eye Distance:   Bilateral Distance:    Right Eye Near:   Left Eye Near:    Bilateral Near:     Physical Exam Vitals and nursing note reviewed.  Constitutional:      Appearance: She is well-developed.     Comments: No acute distress  HENT:     Head: Normocephalic and atraumatic.     Nose: Nose normal.  Eyes:     Conjunctiva/sclera: Conjunctivae normal.  Cardiovascular:     Rate and Rhythm: Normal rate.  Pulmonary:     Effort: Pulmonary effort is normal. No respiratory distress.     Comments: Breathing comfortably at rest, CTABL, no wheezing, rales or other adventitious sounds auscultated  Abdominal:      General: There is no distension.  Musculoskeletal:        General: Normal range of motion.     Cervical back: Neck supple.     Comments: Nontender to palpation of cervical thoracic spine midline, increased tenderness throughout right mid and upper thoracic musculature, full active range of motion of shoulders, strength 5/5 ankle bilaterally  Skin:    General: Skin is warm and dry.  Neurological:     Mental Status: She is alert and oriented to person, place, and time.      UC Treatments / Results  Labs (all labs ordered are listed, but only abnormal results are displayed) Labs Reviewed - No data to display  EKG   Radiology No results found.  Procedures Procedures (including critical care time)  Medications Ordered in UC Medications - No data to display  Initial Impression / Assessment and Plan / UC Course  I have reviewed the triage vital signs and the nursing notes.  Pertinent labs & imaging results that were available during my care of the patient were reviewed by me and considered in my medical decision making (see chart for details).     Thoracic strain-no mechanism of injury, no neuro deficits, suspect likely strain/inflammation, anti-inflammatories and muscle relaxers.  Discussed strict return precautions. Patient verbalized understanding and is agreeable with plan.  Final Clinical Impressions(s) / UC Diagnoses   Final diagnoses:  Acute right-sided thoracic back pain     Discharge Instructions     Use anti-inflammatories for pain/swelling. You may take up to 800 mg Ibuprofen every 8 hours with food. You may supplement Ibuprofen with Tylenol [phone] mg every 8 hours.  Use tizanidine at home/bedtime-this is muscle relaxer, may cause drowsiness Alternate ice and heat Gentle stretching and movement Follow-up if not improving or worsening    ED Prescriptions    Medication Sig Dispense Auth. Provider   ibuprofen (ADVIL) 800 MG tablet Take 1 tablet (800 mg  total) by mouth 3 (three) times daily. 21 tablet Hayzlee Mcsorley C, PA-C   tiZANidine (ZANAFLEX) 4 MG tablet Take 0.5-1 tablets (2-4 mg total) by mouth every 6 (six) hours as needed for muscle spasms. 30 tablet Sandip Power, Palmer C, PA-C     PDMP not reviewed this encounter.   Lew Dawes, PA-C 03/20/20 1351

## 2020-03-20 NOTE — ED Triage Notes (Signed)
Reports attempting to sit down this AM when she felt sudden onset right upper back pain.  C/O area being tender to touch; c/o painful movements.  Has taken IBU 400mg .

## 2020-03-20 NOTE — Discharge Instructions (Signed)
Use anti-inflammatories for pain/swelling. You may take up to 800 mg Ibuprofen every 8 hours with food. You may supplement Ibuprofen with Tylenol [phone] mg every 8 hours.  Use tizanidine at home/bedtime-this is muscle relaxer, may cause drowsiness Alternate ice and heat Gentle stretching and movement Follow-up if not improving or worsening

## 2020-03-20 NOTE — ED Notes (Signed)
Per PCA: pt left due to financial reasons

## 2020-04-18 ENCOUNTER — Encounter: Payer: Self-pay | Admitting: Family Medicine

## 2020-04-18 MED ORDER — FLUOXETINE HCL 20 MG PO TABS: 20.0000 mg | ORAL_TABLET | Freq: Every day | ORAL | 1 refills | Status: DC

## 2020-05-10 ENCOUNTER — Ambulatory Visit (INDEPENDENT_AMBULATORY_CARE_PROVIDER_SITE_OTHER): Payer: BC Managed Care – PPO

## 2020-05-10 ENCOUNTER — Other Ambulatory Visit: Payer: Self-pay

## 2020-05-10 DIAGNOSIS — Z3042 Encounter for surveillance of injectable contraceptive: Secondary | ICD-10-CM

## 2020-05-10 MED ORDER — MEDROXYPROGESTERONE ACETATE 150 MG/ML IM SUSP
150.0000 mg | Freq: Once | INTRAMUSCULAR | Status: AC
Start: 2020-05-10 — End: 2020-05-10
  Administered 2020-05-10: 150 mg via INTRAMUSCULAR

## 2020-05-10 NOTE — Progress Notes (Signed)
Patient here today for Depo Provera injection and is within her dates.    Last contraceptive appt was 09/2019  Depo given in RUOQ today.  Site unremarkable & patient tolerated injection.    Next injection due 7/26-8/9.  Reminder card given.    Veronda Prude, RN

## 2020-07-28 ENCOUNTER — Ambulatory Visit (INDEPENDENT_AMBULATORY_CARE_PROVIDER_SITE_OTHER): Payer: BC Managed Care – PPO

## 2020-07-28 ENCOUNTER — Other Ambulatory Visit: Payer: Self-pay | Admitting: Family Medicine

## 2020-07-28 ENCOUNTER — Other Ambulatory Visit: Payer: Self-pay

## 2020-07-28 DIAGNOSIS — Z3042 Encounter for surveillance of injectable contraceptive: Secondary | ICD-10-CM

## 2020-07-28 MED ORDER — MEDROXYPROGESTERONE ACETATE 150 MG/ML IM SUSP
150.0000 mg | Freq: Once | INTRAMUSCULAR | Status: AC
  Administered 2020-07-28: 150 mg via INTRAMUSCULAR

## 2020-07-28 MED ORDER — FLUOXETINE HCL 20 MG PO TABS: 20.0000 mg | ORAL_TABLET | Freq: Every day | ORAL | 1 refills | Status: DC

## 2020-07-28 NOTE — Progress Notes (Signed)
Patient here today for Depo Provera injection and is within her dates.    Last contraceptive appt was 09/2019. Advised patient that she would need to schedule next appointment with PCP for yearly contraceptive management appointment. Patient verbalizes understanding.   Depo given in LUOQ today.  Site unremarkable & patient tolerated injection.    Next injection due 10/13-10/27.  Reminder card given.    Veronda Prude, RN

## 2020-10-20 ENCOUNTER — Other Ambulatory Visit: Payer: Self-pay

## 2020-10-20 ENCOUNTER — Encounter: Payer: Self-pay | Admitting: Family Medicine

## 2020-10-20 ENCOUNTER — Ambulatory Visit (INDEPENDENT_AMBULATORY_CARE_PROVIDER_SITE_OTHER): Payer: BC Managed Care – PPO

## 2020-10-20 ENCOUNTER — Other Ambulatory Visit (HOSPITAL_COMMUNITY)
Admission: RE | Admit: 2020-10-20 | Discharge: 2020-10-20 | Disposition: A | Discharge status: Home / Self Care | Payer: BC Managed Care – PPO | Source: Ambulatory Visit | Attending: Family Medicine | Admitting: Family Medicine

## 2020-10-20 ENCOUNTER — Ambulatory Visit (INDEPENDENT_AMBULATORY_CARE_PROVIDER_SITE_OTHER): Payer: BC Managed Care – PPO | Admitting: Family Medicine

## 2020-10-20 VITALS — BP 133/88 | HR 83 | Wt 179.8 lb

## 2020-10-20 DIAGNOSIS — Z124 Encounter for screening for malignant neoplasm of cervix: Secondary | ICD-10-CM

## 2020-10-20 DIAGNOSIS — Z1159 Encounter for screening for other viral diseases: Secondary | ICD-10-CM

## 2020-10-20 DIAGNOSIS — Z114 Encounter for screening for human immunodeficiency virus [HIV]: Secondary | ICD-10-CM

## 2020-10-20 DIAGNOSIS — Z23 Encounter for immunization: Secondary | ICD-10-CM

## 2020-10-20 DIAGNOSIS — Z309 Encounter for contraceptive management, unspecified: Secondary | ICD-10-CM

## 2020-10-20 DIAGNOSIS — F339 Major depressive disorder, recurrent, unspecified: Secondary | ICD-10-CM | POA: Diagnosis not present

## 2020-10-20 MED ORDER — MEDROXYPROGESTERONE ACETATE 150 MG/ML IM SUSY
150.0000 mg | PREFILLED_SYRINGE | Freq: Once | INTRAMUSCULAR | Status: AC
  Administered 2020-10-20: 150 mg via INTRAMUSCULAR

## 2020-10-20 NOTE — Patient Instructions (Signed)
It was great seeing you today.  I am glad you are doing well.  Lets try taking your depression medication at night and see if that helps with the sleep.  If it does not you can go back to the daytime and we can adjust her dose.  We completed a Pap smear today and I will send the results to your MyChart and if there is any abnormality I will call you.  You will need a repeat Pap smear in 3 years.  We are also going to do our routine HIV and hepatitis screening and those results will go to your MyChart.  You got the COVID-19 booster today as well as your Depo.  If you have any questions or concerns please call the clinic.  I hope you have a great afternoon!

## 2020-10-20 NOTE — Assessment & Plan Note (Signed)
PHQ-9 today of 5.  Doing well on Prozac 20 mg daily.  Feels like the medication makes her sleepy but she has difficulty sleeping at night.  Recommended she try taking the medication at night and we may increase the dose at a future visit.  We are going to continue her current dose of medication.  Return precautions discussed.

## 2020-10-20 NOTE — Progress Notes (Signed)
    SUBJECTIVE:   Chief compliant/HPI: annual examination  Emily Sellers is a 25 y.o. who presents today for an annual exam.   Review of systems form notable for difficulty sleeping.  Reports that she goes to sleep around 8 PM but then wakes up around midnight and stays at 2 between 3 and 4 and then goes back to sleep.  Updated history tabs and problem list .   OBJECTIVE:   BP 133/88   Pulse 83   Wt 179 lb 12.8 oz (81.6 kg)   SpO2 100%   BMI 25.80 kg/m   General: Well-appearing 26 year old female, no acute distress Cardiac: Regular rate and rhythm, no murmurs appreciated Respiratory: Normal work of breathing, lungs clear to auscultation bilaterally Abdomen: Soft, nontender, positive bowel sounds MSK: No gross abnormalities Neuro: No gross abnormalities GU: Normal external female genitalia, normal internal vaginal tissue, mild cervical ectopy  PHQ9 SCORE ONLY 10/20/2020 01/15/2020 12/08/2019  PHQ-9 Total Score 5 4 6     ASSESSMENT/PLAN:   Depression, recurrent (HCC) PHQ-9 today of 5.  Doing well on Prozac 20 mg daily.  Feels like the medication makes her sleepy but she has difficulty sleeping at night.  Recommended she try taking the medication at night and we may increase the dose at a future visit.  We are going to continue her current dose of medication.  Return precautions discussed.    Annual Examination  See AVS for age appropriate recommendations.   PHQ score 5, reviewed and discussed. Blood pressure reviewed and mildly elevated today.  This is the first time she has had elevated blood pressures and she is extremely anxious regarding her Pap smear as well as blood tests.  Will monitor and recheck at next visit. The patient currently uses Depo-Provera for contraception. .    Considered the following items based upon USPSTF recommendations: HIV testing: ordered Hepatitis C: ordered GC/CT 24 or  younger, ordered. Lipid panel (nonfasting or fasting) discussed  based upon AHA recommendations and not ordered.  Consider repeat every 4-6 years.  Reviewed risk factors for latent tuberculosis and not indicated Cervical cancer screening: due for Pap today, cytology alone ordered (HPV if ASCUS) Immunizations patient received COVID-19 booster today.  She declined flu.  She needs Tdap.  Follow up in 1  year or sooner if indicated.    , MD [facility]

## 2020-10-21 LAB — HEPATITIS C ANTIBODY: Hep C Virus Ab: <0.1 s/co ratio (ref 0.0–0.9)

## 2020-10-21 LAB — HIV ANTIBODY (ROUTINE TESTING W REFLEX): HIV Screen 4th Generation wRfx: NONREACTIVE

## 2020-10-24 LAB — CYTOLOGY - PAP
Chlamydia: NEGATIVE
Comment: NEGATIVE
Comment: NEGATIVE
Comment: NORMAL
Diagnosis: UNDETERMINED — AB
High risk HPV: POSITIVE — AB
Neisseria Gonorrhea: NEGATIVE

## 2020-12-14 ENCOUNTER — Encounter: Payer: Self-pay | Admitting: Family Medicine

## 2020-12-14 ENCOUNTER — Ambulatory Visit: Payer: BC Managed Care – PPO | Admitting: Family Medicine

## 2020-12-14 ENCOUNTER — Other Ambulatory Visit: Payer: Self-pay

## 2020-12-14 VITALS — BP 121/78 | HR 95 | Temp 99.8°F | Ht 70.0 in | Wt 186.4 lb

## 2020-12-14 DIAGNOSIS — R059 Cough, unspecified: Secondary | ICD-10-CM

## 2020-12-14 DIAGNOSIS — J111 Influenza due to unidentified influenza virus with other respiratory manifestations: Secondary | ICD-10-CM | POA: Diagnosis not present

## 2020-12-14 DIAGNOSIS — B9789 Other viral agents as the cause of diseases classified elsewhere: Secondary | ICD-10-CM | POA: Diagnosis not present

## 2020-12-14 DIAGNOSIS — J988 Other specified respiratory disorders: Secondary | ICD-10-CM

## 2020-12-14 LAB — POCT INFLUENZA A/B
Influenza A, POC: POSITIVE — AB
Influenza B, POC: NEGATIVE

## 2020-12-14 MED ORDER — OSELTAMIVIR PHOSPHATE 75 MG PO CAPS: 75.0000 mg | ORAL_CAPSULE | Freq: Two times a day (BID) | ORAL | 0 refills | Status: AC

## 2020-12-14 NOTE — Patient Instructions (Signed)
Take Tamiflu twice a day for the next 5 days.  If your shortness of breath gets worse, seek immediate care.

## 2020-12-14 NOTE — Progress Notes (Addendum)
° °  SUBJECTIVE:   CHIEF COMPLAINT / HPI:   Chief Complaint  Patient presents with   Nasal Congestion     Emily Sellers is a 25 y.o. female here for nasal congestion and cough. Endorses shortness of breath, headache and body aches. Denies fever, nausea, vomiting, diarrhea, abdominal pain, sore throat. She took ibuprofen for her headache and body aches.  Notes she was shortness of breath when walking up the stairs today.  Her mom has similar sx and tested positive for the flu.     PERTINENT  PMH / PSH: reviewed and updated as appropriate   OBJECTIVE:   BP 121/78    Pulse 95    Temp 99.8 F (37.7 C) (Oral)    Ht 5\' 10"  (1.778 m)    Wt 186 lb 6 oz (84.5 kg)    SpO2 100%    BMI 26.74 kg/m    GEN:     alert, well appearing and no distress    HENT:  :  mucus membranes moist, oropharyngeal without lesions, nares patent, no nasal discharge EYES:   pupils equal and reactive, no scleral injection NECK:  normal ROM, no lymphadenopathy  RESP:  clear to auscultation bilaterally, no increased work of breathing  CVS:   regular rate and rhythm Skin:   warm and dry and well perfused     ASSESSMENT/PLAN:   Viral Respiratory Infection   Influenza A Exam and history consistent with viral illness. She has known influenza exposure. Influenza positive. Overall pt is well appearing, well hydrated, without respiratory distress. Discussed treatment with Tamiflu and pt agreeable.  - continue to monitor for fevers  - continue Tylenol/ Motrin as needed for discomfort - nasal saline to help with his nasal congestion - Use a cool mist humidifier/steam from hot shower at bedtime to help with breathing - Stressed hydration - OTC cough suppressant  - Work note provided  - Discussed return precautions, understanding voiced   , DO PGY-3, [facility] 12/14/2020  `

## 2020-12-15 LAB — NOVEL CORONAVIRUS, NAA: SARS-CoV-2, NAA: NOT DETECTED

## 2020-12-15 LAB — SARS-COV-2, NAA 2 DAY TAT

## 2021-01-12 ENCOUNTER — Ambulatory Visit (INDEPENDENT_AMBULATORY_CARE_PROVIDER_SITE_OTHER): Payer: BC Managed Care – PPO

## 2021-01-12 ENCOUNTER — Other Ambulatory Visit: Payer: Self-pay

## 2021-01-12 DIAGNOSIS — Z3042 Encounter for surveillance of injectable contraceptive: Secondary | ICD-10-CM

## 2021-01-12 MED ORDER — MEDROXYPROGESTERONE ACETATE 150 MG/ML IM SUSP
150.0000 mg | Freq: Once | INTRAMUSCULAR | Status: AC
Start: 2021-01-12 — End: 2021-01-12
  Administered 2021-01-12: 150 mg via INTRAMUSCULAR

## 2021-01-12 NOTE — Progress Notes (Signed)
Patient here today for Depo Provera injection and is within her dates.     Last contraceptive apt was 10/20/2020.   Depo given in LUOQ today. Site unremarkable & patient tolerated injection.     Next injection due 103/30/2023-04/13/2021. Reminder card given.

## 2021-02-25 ENCOUNTER — Ambulatory Visit
Admission: EM | Admit: 2021-02-25 | Discharge: 2021-02-25 | Disposition: A | Discharge status: Home / Self Care | Payer: BC Managed Care – PPO | Attending: Internal Medicine | Admitting: Internal Medicine

## 2021-02-25 ENCOUNTER — Other Ambulatory Visit: Payer: Self-pay

## 2021-02-25 DIAGNOSIS — R509 Fever, unspecified: Secondary | ICD-10-CM | POA: Diagnosis not present

## 2021-02-25 DIAGNOSIS — J069 Acute upper respiratory infection, unspecified: Secondary | ICD-10-CM

## 2021-02-25 LAB — POCT INFLUENZA A/B
Influenza A, POC: NEGATIVE
Influenza B, POC: NEGATIVE

## 2021-02-25 MED ORDER — GUAIFENESIN 200 MG PO TABS: 200.0000 mg | ORAL_TABLET | ORAL | 0 refills | Status: DC | PRN

## 2021-02-25 MED ORDER — FLUTICASONE PROPIONATE 50 MCG/ACT NA SUSP: 1.0000 | Freq: Every day | NASAL | 0 refills | Status: DC

## 2021-02-25 NOTE — Discharge Instructions (Signed)
Your rapid flu test was negative.  It appears that you have a viral upper respiratory infection that should self resolve in the next few days.  You have been prescribed 2 medications to alleviate symptoms.  There are no antibiotics needed at this time.  Please monitor fevers and treat as appropriate.  Follow-up if symptoms persist or worsen.

## 2021-02-25 NOTE — ED Triage Notes (Signed)
Call went straight to vm

## 2021-02-25 NOTE — ED Triage Notes (Signed)
Pt c/y nasal drainage, cough, headaches, chest ache from coughing, fever onset ~ wed.

## 2021-02-25 NOTE — ED Provider Notes (Signed)
[name]    CSN: [id number] Arrival date & time: 02/25/21  9675      History   Chief Complaint Chief Complaint  Patient presents with   Cough    HPI TONISHIA STEFFY is a 26 y.o. female.   Patient presents with cough, runny nose, nasal congestion that has been present for approximately 4 days.  Patient denies any known fevers at home.  She denies any known sick contacts.  Denies chest pain, shortness of breath, sore throat, ear pain, nausea, vomiting, diarrhea, abdominal pain.  Patient is taking ibuprofen for symptoms and last dose was in the waiting room in the lobby of the urgent care.   Cough  Past Medical History:  Diagnosis Date   Anxiety    Depression     Patient Active Problem List   Diagnosis Date Noted   Eye pain, right 01/15/2020   Depression, recurrent (HCC) 09/15/2019   Nexplanon in place 06/26/2016   Contraceptive management 03/23/2016   Acute viral pharyngitis 10/24/2015    History reviewed. No pertinent surgical history.  OB History   No obstetric history on file.      Home Medications    Prior to Admission medications   Medication Sig Start Date End Date Taking? Authorizing Provider  fluticasone (FLONASE) 50 MCG/ACT nasal spray Place 1 spray into both nostrils daily for 3 days. 02/25/21 02/28/21 Yes Dawnetta Copenhaver, Acie Fredrickson, FNP  guaiFENesin 200 MG tablet Take 1 tablet (200 mg total) by mouth every 4 (four) hours as needed for cough or to loosen phlegm. 02/25/21  Yes Kylar Speelman, Rolly Salter E, FNP  FLUoxetine (PROZAC) 20 MG tablet Take 1 tablet (20 mg total) by mouth daily. 07/28/20   Derrel Nip, MD  ibuprofen (ADVIL) 800 MG tablet Take 1 tablet (800 mg total) by mouth 3 (three) times daily. 03/20/20   Wieters, Hallie C, PA-C  medroxyPROGESTERone Acetate (DEPO-PROVERA IM) Inject into the muscle.    [provider]  tiZANidine (ZANAFLEX) 4 MG tablet Take 0.5-1 tablets (2-4 mg total) by mouth every 6 (six) hours as needed for muscle  spasms. 03/20/20   Wieters, Hallie C, PA-C  ARIPiprazole (ABILIFY) 10 MG tablet Take 10 mg by mouth daily. Patient not taking: Reported on 09/15/2019  03/20/20  [provider]    Family History Family History  Problem Relation Age of Onset   Healthy Mother    Healthy Father     Social History Social History   Tobacco Use   Smoking status: Never   Smokeless tobacco: Never  Vaping Use   Vaping Use: Never used  Substance Use Topics   Alcohol use: Yes    Comment: occasionally   Drug use: Yes    Types: Marijuana     Allergies   Patient has no known allergies.   Review of Systems Review of Systems Per HPI  Physical Exam Triage Vital Signs ED Triage Vitals  Enc Vitals Group     BP 02/25/21 1041 (!) 143/83     Pulse Rate 02/25/21 1041 (!) 101     Resp 02/25/21 1041 18     Temp 02/25/21 1041 (!) 101.3 F (38.5 C)     Temp Source 02/25/21 1041 Oral     SpO2 02/25/21 1041 97 %     Weight --      Height --      Head Circumference --      Peak Flow --      Pain Score 02/25/21 1042 0  Pain Loc --      Pain Edu? --      Excl. in [place]? --    No data found.  Updated Vital Signs BP (!) 143/83 (BP Location: Left Arm)    Pulse (!) 101    Temp (!) 101.3 F (38.5 C) (Oral)    Resp 18    SpO2 97%   Visual Acuity Right Eye Distance:   Left Eye Distance:   Bilateral Distance:    Right Eye Near:   Left Eye Near:    Bilateral Near:     Physical Exam Constitutional:      General: She is not in acute distress.    Appearance: Normal appearance. She is not toxic-appearing or diaphoretic.  HENT:     Head: Normocephalic and atraumatic.     Right Ear: Tympanic membrane and ear canal normal.     Left Ear: Tympanic membrane and ear canal normal.     Nose: Congestion present.     Mouth/Throat:     Mouth: Mucous membranes are moist.     Pharynx: No posterior oropharyngeal erythema.  Eyes:     Extraocular Movements: Extraocular movements intact.      Conjunctiva/sclera: Conjunctivae normal.     Pupils: Pupils are equal, round, and reactive to light.  Cardiovascular:     Rate and Rhythm: Normal rate and regular rhythm.     Pulses: Normal pulses.     Heart sounds: Normal heart sounds.  Pulmonary:     Effort: Pulmonary effort is normal. No respiratory distress.     Breath sounds: Normal breath sounds. No stridor. No wheezing, rhonchi or rales.  Abdominal:     General: Abdomen is flat. Bowel sounds are normal.     Palpations: Abdomen is soft.  Musculoskeletal:        General: Normal range of motion.     Cervical back: Normal range of motion.  Skin:    General: Skin is warm and dry.  Neurological:     General: No focal deficit present.     Mental Status: She is alert and oriented to person, place, and time. Mental status is at baseline.  Psychiatric:        Mood and Affect: Mood normal.        Behavior: Behavior normal.        Thought Content: Thought content normal.        Judgment: Judgment normal.     UC Treatments / Results  Labs (all labs ordered are listed, but only abnormal results are displayed) Labs Reviewed  NOVEL CORONAVIRUS, NAA  POCT INFLUENZA A/B    EKG   Radiology No results found.  Procedures Procedures (including critical care time)  Medications Ordered in UC Medications - No data to display  Initial Impression / Assessment and Plan / UC Course  I have reviewed the triage vital signs and the nursing notes.  Pertinent labs & imaging results that were available during my care of the patient were reviewed by me and considered in my medical decision making (see chart for details).     Patient presents with symptoms likely from a viral upper respiratory infection. Differential includes bacterial pneumonia, sinusitis, allergic rhinitis, COVID-19, flu. Do not suspect underlying cardiopulmonary process. Symptoms seem unlikely related to ACS, CHF or COPD exacerbations, pneumonia, pneumothorax. Patient is  nontoxic appearing and not in need of emergent medical intervention.  Rapid flu was negative.  COVID test pending.  Recommended symptom control with over the counter  medications: Daily oral anti-histamine, Oral decongestant or IN corticosteroid, saline irrigations, cepacol lozenges, Robitussin, Delsym, honey tea.  Patient sent prescriptions.  Return if symptoms fail to improve in 1-2 weeks or you develop shortness of breath, chest pain, severe headache. Patient states understanding and is agreeable.  Discharged with PCP followup.  Final Clinical Impressions(s) / UC Diagnoses   Final diagnoses:  Viral upper respiratory tract infection with cough  Fever, unspecified     Discharge Instructions      Your rapid flu test was negative.  It appears that you have a viral upper respiratory infection that should self resolve in the next few days.  You have been prescribed 2 medications to alleviate symptoms.  There are no antibiotics needed at this time.  Please monitor fevers and treat as appropriate.  Follow-up if symptoms persist or worsen.    ED Prescriptions     Medication Sig Dispense Auth. Provider   fluticasone (FLONASE) 50 MCG/ACT nasal spray Place 1 spray into both nostrils daily for 3 days. 16 g Ervin Knack E, [place]   guaiFENesin 200 MG tablet Take 1 tablet (200 mg total) by mouth every 4 (four) hours as needed for cough or to loosen phlegm. 30 suppository Benns [place], Acie Fredrickson, [place]      PDMP not reviewed this encounter.   Gustavus Bryant, [place] 02/25/21 1106

## 2021-02-26 LAB — NOVEL CORONAVIRUS, NAA: SARS-CoV-2, NAA: NOT DETECTED

## 2021-03-04 ENCOUNTER — Encounter (HOSPITAL_COMMUNITY): Payer: Self-pay

## 2021-03-04 ENCOUNTER — Other Ambulatory Visit: Payer: Self-pay

## 2021-03-04 ENCOUNTER — Ambulatory Visit (HOSPITAL_COMMUNITY)
Admission: RE | Admit: 2021-03-04 | Discharge: 2021-03-04 | Disposition: A | Discharge status: Home / Self Care | Payer: BC Managed Care – PPO | Source: Ambulatory Visit | Attending: Urgent Care | Admitting: Urgent Care

## 2021-03-04 VITALS — BP 128/84 | HR 118 | Temp 99.0°F | Resp 17

## 2021-03-04 DIAGNOSIS — R000 Tachycardia, unspecified: Secondary | ICD-10-CM

## 2021-03-04 DIAGNOSIS — J019 Acute sinusitis, unspecified: Secondary | ICD-10-CM

## 2021-03-04 DIAGNOSIS — R053 Chronic cough: Secondary | ICD-10-CM

## 2021-03-04 DIAGNOSIS — F1290 Cannabis use, unspecified, uncomplicated: Secondary | ICD-10-CM | POA: Diagnosis not present

## 2021-03-04 DIAGNOSIS — R0981 Nasal congestion: Secondary | ICD-10-CM

## 2021-03-04 MED ORDER — AMOXICILLIN-POT CLAVULANATE 875-125 MG PO TABS: 1.0000 | ORAL_TABLET | Freq: Two times a day (BID) | ORAL | 0 refills | Status: DC

## 2021-03-04 MED ORDER — LEVOCETIRIZINE DIHYDROCHLORIDE 5 MG PO TABS
5.0000 mg | ORAL_TABLET | Freq: Every evening | ORAL | 0 refills | Status: DC
Start: 2021-03-04 — End: 2021-05-08

## 2021-03-04 MED ORDER — BENZONATATE 100 MG PO CAPS
100.0000 mg | ORAL_CAPSULE | Freq: Three times a day (TID) | ORAL | 0 refills | Status: DC | PRN
Start: 2021-03-04 — End: 2021-05-08

## 2021-03-04 MED ORDER — PROMETHAZINE-DM 6.25-15 MG/5ML PO SYRP: 5.0000 mL | ORAL_SOLUTION | Freq: Every evening | ORAL | 0 refills | Status: DC | PRN

## 2021-03-04 NOTE — ED Triage Notes (Signed)
Pt presents with ongoing non productive cough and congestion X  9 days that is unrelieved with OTC medication. ?

## 2021-03-04 NOTE — ED Provider Notes (Signed)
?[facility] ? ? ?MRN: [id number] DOB: 09/01/1995 ? ?Subjective:  ? ?Emily Sellers is a 26 y.o. female presenting for 10-day history of acute onset persistent sinus congestion, sinus pressure, persistent coughing.  The cough is mostly dry but when is productive it hurts her throat.  No chest pain, shortness of breath or wheezing.  Patient is a daily marijuana user.  No history of asthma.  She was seen before and has been using Flonase and guaifenesin. ? ?No current facility-administered medications for this encounter. ? ?Current Outpatient Medications:  ?  FLUoxetine (PROZAC) 20 MG tablet, Take 1 tablet (20 mg total) by mouth daily., Disp: 90 tablet, Rfl: 1 ?  fluticasone (FLONASE) 50 MCG/ACT nasal spray, Place 1 spray into both nostrils daily for 3 days., Disp: 16 g, Rfl: 0 ?  guaiFENesin 200 MG tablet, Take 1 tablet (200 mg total) by mouth every 4 (four) hours as needed for cough or to loosen phlegm., Disp: 30 suppository, Rfl: 0 ?  ibuprofen (ADVIL) 800 MG tablet, Take 1 tablet (800 mg total) by mouth 3 (three) times daily., Disp: 21 tablet, Rfl: 0 ?  medroxyPROGESTERone Acetate (DEPO-PROVERA IM), Inject into the muscle., Disp: , Rfl:  ?  tiZANidine (ZANAFLEX) 4 MG tablet, Take 0.5-1 tablets (2-4 mg total) by mouth every 6 (six) hours as needed for muscle spasms., Disp: 30 tablet, Rfl: 0  ? ?No Known Allergies ? ?Past Medical History:  ?Diagnosis Date  ? Anxiety   ? Depression   ?  ? ?History reviewed. No pertinent surgical history. ? ?Family History  ?Problem Relation Age of Onset  ? Healthy Mother   ? Healthy Father   ? ? ?Social History  ? ?Tobacco Use  ? Smoking status: Never  ? Smokeless tobacco: Never  ?Vaping Use  ? Vaping Use: Never used  ?Substance Use Topics  ? Alcohol use: Yes  ?  Comment: occasionally  ? Drug use: Yes  ?  Types: Marijuana  ? ? ?ROS ? ? ?Objective:  ? ?Vitals: ?BP 128/84 (BP Location: Right Arm)   Pulse (!) 118   Temp 99 ?F (37.2 ?C) (Oral)   Resp 17    SpO2 96%  ? ?Physical Exam ?Constitutional:   ?   General: She is not in acute distress. ?   Appearance: Normal appearance. She is well-developed and normal weight. She is not ill-appearing, toxic-appearing or diaphoretic.  ?HENT:  ?   Head: Normocephalic and atraumatic.  ?   Right Ear: Tympanic membrane, ear canal and external ear normal. No drainage or tenderness. No middle ear effusion. There is no impacted cerumen. Tympanic membrane is not erythematous.  ?   Left Ear: Tympanic membrane, ear canal and external ear normal. No drainage or tenderness.  No middle ear effusion. There is no impacted cerumen. Tympanic membrane is not erythematous.  ?   Nose: Congestion and rhinorrhea present.  ?   Mouth/Throat:  ?   Mouth: Mucous membranes are moist. No oral lesions.  ?   Pharynx: No pharyngeal swelling, oropharyngeal exudate, posterior oropharyngeal erythema or uvula swelling.  ?   Tonsils: No tonsillar exudate or tonsillar abscesses.  ?Eyes:  ?   General: No scleral icterus.    ?   Right eye: No discharge.     ?   Left eye: No discharge.  ?   Extraocular Movements: Extraocular movements intact.  ?   Right eye: Normal extraocular motion.  ?   Left eye: Normal extraocular  motion.  ?   Conjunctiva/sclera: Conjunctivae normal.  ?Cardiovascular:  ?   Rate and Rhythm: Tachycardia present.  ?   Heart sounds: No murmur heard. ?  No friction rub. No gallop.  ?   Comments: Pulse recheck ranged 101-106bpm. ?Pulmonary:  ?   Effort: Pulmonary effort is normal. No respiratory distress.  ?   Breath sounds: No stridor. No wheezing, rhonchi or rales.  ?Chest:  ?   Chest wall: No tenderness.  ?Musculoskeletal:  ?   Cervical back: Normal range of motion and neck supple.  ?Lymphadenopathy:  ?   Cervical: No cervical adenopathy.  ?Skin: ?   General: Skin is warm and dry.  ?Neurological:  ?   General: No focal deficit present.  ?   Mental Status: She is alert and oriented to person, place, and time.  ?Psychiatric:     ?   Mood and Affect:  Mood normal.     ?   Behavior: Behavior normal.  ? ? ?Assessment and Plan :  ? ?PDMP not reviewed this encounter. ? ?1. Acute non-recurrent sinusitis, unspecified location   ?2. Persistent cough   ?3. Marijuana smoker   ?4. Sinus congestion   ?5. Tachycardia, unspecified   ? ?Low suspicion for an acute cardiopulmonary event despite her tachycardia.  It is borderline but she is not having chest pain or shortness of breath.  Her symptoms are worse in the upper sinuses.  Discussed this with patient and we will manage her for a sinusitis as opposed to pneumonia.  Given her clear lung sounds, deferred imaging.  Also deferred respiratory testing as she was already seen, tested negative and does not fit with the timeline of her illness. Counseled patient on potential for adverse effects with medications prescribed/recommended today, ER and return-to-clinic precautions discussed, patient verbalized understanding. ? ?  ?Jaynee Eagles, PA-C ?03/04/21 1240 ? ?

## 2021-04-04 ENCOUNTER — Ambulatory Visit (INDEPENDENT_AMBULATORY_CARE_PROVIDER_SITE_OTHER): Payer: BC Managed Care – PPO

## 2021-04-04 DIAGNOSIS — Z3042 Encounter for surveillance of injectable contraceptive: Secondary | ICD-10-CM

## 2021-04-04 MED ORDER — MEDROXYPROGESTERONE ACETATE 150 MG/ML IM SUSP
150.0000 mg | Freq: Once | INTRAMUSCULAR | Status: AC
  Administered 2021-04-04: 150 mg via INTRAMUSCULAR

## 2021-04-04 NOTE — Progress Notes (Signed)
Patient here today for Depo Provera injection and is within her dates.   ?  ?Last contraceptive apt was 10/20/2020. ?  ?Depo given in RUOQ today. Site unremarkable & patient tolerated injection.   ? ?Next injection due 06/20/2021-07/04/2021. ?

## 2021-04-21 ENCOUNTER — Other Ambulatory Visit: Payer: Self-pay | Admitting: Family Medicine

## 2021-05-08 ENCOUNTER — Ambulatory Visit: Payer: BC Managed Care – PPO | Admitting: Family Medicine

## 2021-05-08 ENCOUNTER — Other Ambulatory Visit (HOSPITAL_COMMUNITY)
Admission: RE | Admit: 2021-05-08 | Discharge: 2021-05-08 | Disposition: A | Discharge status: Home / Self Care | Payer: BC Managed Care – PPO | Source: Ambulatory Visit | Attending: Family Medicine | Admitting: Family Medicine

## 2021-05-08 ENCOUNTER — Encounter: Payer: Self-pay | Admitting: Family Medicine

## 2021-05-08 VITALS — BP 110/70 | HR 90 | Ht 70.0 in | Wt 189.0 lb

## 2021-05-08 DIAGNOSIS — N898 Other specified noninflammatory disorders of vagina: Secondary | ICD-10-CM | POA: Diagnosis present

## 2021-05-08 DIAGNOSIS — Z113 Encounter for screening for infections with a predominantly sexual mode of transmission: Secondary | ICD-10-CM | POA: Insufficient documentation

## 2021-05-08 LAB — POCT WET PREP (WET MOUNT)
Clue Cells Wet Prep Whiff POC: NEGATIVE
Trichomonas Wet Prep HPF POC: ABSENT

## 2021-05-08 NOTE — Patient Instructions (Addendum)
It was great to see you! ? ?Your vaginal testing did not show any yeast or bacterial overgrowth. ? ?We also did routine testing for sexually transmitted infections. I will send you a MyChart message in approximately 2 days with the results or call if they are abnormal. ? ? ?Take care, ?Dr Rock Nephew ? ?

## 2021-05-08 NOTE — Progress Notes (Signed)
? ? ?  SUBJECTIVE:  ? ?CHIEF COMPLAINT / HPI:  ? 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 ?OBJECTIVE:  ? ?BP 110/70   Pulse 90   Ht 5\' 10"  (1.778 m)   Wt 189 lb (85.7 kg)   SpO2 98%   BMI 27.12 kg/m?   ?General: NAD, pleasant, able to participate in exam ?Respiratory: No respiratory distress ?Skin: warm and dry, no rashes noted ?Psych: Normal affect and mood ?Neuro: grossly intact ?GU/GYN: Exam performed in the presence of a chaperone. External genitalia within normal limits.  Vaginal mucosa pink, moist, normal rugae.  Nonfriable cervix without lesions, no discharge or bleeding noted on speculum exam.  ? ?ASSESSMENT/PLAN:  ? ?Vaginal itching; Routine STI Testing ?x1 week. No abnormality appreciated on pelvic exam. Wet prep negative for yeast, BV, or trich. ?GC/chlamydia obtained and pending. Performed shared decision making regarding blood work and ultimately elected not to obtain HIV/RPR today, patient with low risk sexual activity and is reliably using condoms. ? ? ?Alcus Dad, MD ?[place]  ?

## 2021-05-08 NOTE — Assessment & Plan Note (Signed)
x1 week. No abnormality appreciated on pelvic exam. Wet prep negative for yeast, BV, or trich. ?GC/chlamydia obtained and pending. Performed shared decision making regarding blood work and ultimately elected not to obtain HIV/RPR today, patient with low risk sexual activity and is reliably using condoms. ?

## 2021-05-09 LAB — CERVICOVAGINAL ANCILLARY ONLY
Bacterial Vaginitis (gardnerella): NEGATIVE
Candida Glabrata: NEGATIVE
Candida Vaginitis: POSITIVE — AB
Chlamydia: NEGATIVE
Comment: NEGATIVE
Comment: NEGATIVE
Comment: NEGATIVE
Comment: NEGATIVE
Comment: NEGATIVE
Comment: NORMAL
Neisseria Gonorrhea: NEGATIVE
Trichomonas: NEGATIVE

## 2021-05-09 MED ORDER — FLUCONAZOLE 150 MG PO TABS: 150.0000 mg | ORAL_TABLET | Freq: Once | ORAL | 0 refills | Status: AC

## 2021-05-09 NOTE — Addendum Note (Signed)
Addended by: Maury Dus on: 05/09/2021 01:40 PM ? ? Modules accepted: Orders ? ?

## 2021-06-06 ENCOUNTER — Encounter: Payer: Self-pay | Admitting: *Deleted

## 2021-06-12 ENCOUNTER — Encounter (HOSPITAL_COMMUNITY): Payer: Self-pay

## 2021-06-12 ENCOUNTER — Ambulatory Visit (HOSPITAL_COMMUNITY)
Admission: RE | Admit: 2021-06-12 | Discharge: 2021-06-12 | Disposition: A | Discharge status: Home / Self Care | Payer: BC Managed Care – PPO | Source: Ambulatory Visit | Attending: Physician Assistant | Admitting: Physician Assistant

## 2021-06-12 VITALS — BP 120/77 | HR 85 | Temp 98.9°F | Resp 18

## 2021-06-12 DIAGNOSIS — K921 Melena: Secondary | ICD-10-CM

## 2021-06-12 DIAGNOSIS — K648 Other hemorrhoids: Secondary | ICD-10-CM

## 2021-06-12 MED ORDER — HYDROCORTISONE ACETATE 25 MG RE SUPP
25.0000 mg | Freq: Two times a day (BID) | RECTAL | 0 refills | Status: DC
Start: 2021-06-12 — End: 2021-12-08

## 2021-06-12 NOTE — Discharge Instructions (Signed)
I believe that you have a hemorrhoid causing your symptoms.  Use hydrocortisone suppositories twice daily.  Make sure that you avoid constipation by eating plenty of fiber and drinking fluids.  Follow-up with GI specialist; call to schedule an appointment.  If you have any recurrent bloody bowel movements, abdominal pain, nausea, vomiting, weakness, chest pain, shortness of breath, heart racing you need to be seen immediately.

## 2021-06-12 NOTE — ED Provider Notes (Signed)
[facility]    CSN: [id number] Arrival date & time: 06/12/21  1528      History   Chief Complaint Chief Complaint  Patient presents with   Constipation    Blood in stool - Entered by patient   Abdominal Pain    HPI Emily Sellers is a 26 y.o. female.   Patient presents today with a 1 day history of blood in her stool.  Reports that this morning she had a normal bowel movement when she went to wipe noticed blood on the toilet tissue and in the toilet bowl.  She has had not had any bleeding on her underwear or ongoing rectal bleeding.  She denies any abdominal pain, fever, nausea, vomiting.  Denies any significant constipation.  Does not take blood thinning medication.  Denies personal or family history of inflammatory bowel disease including ulcerative colitis or Crohn's disease.  She has not tried any over-the-counter medication for symptom management.  She is confident that she is not pregnant.  She has not had recurrent episodes but was concerned prompting evaluation today.    Past Medical History:  Diagnosis Date   Anxiety    Depression     Patient Active Problem List   Diagnosis Date Noted   Vaginal itching 05/08/2021   Eye pain, right 01/15/2020   Depression, recurrent ([place]) 09/15/2019   Contraceptive management 03/23/2016    History reviewed. No pertinent surgical history.  OB History   No obstetric history on file.      Home Medications    Prior to Admission medications   Medication Sig Start Date End Date Taking? Authorizing Provider  hydrocortisone (ANUSOL-HC) 25 MG suppository Place 1 suppository (25 mg total) rectally 2 (two) times daily. 06/12/21  Yes Ashtyn Meland K, PA-C  FLUoxetine (PROZAC) 20 MG tablet TAKE 1 TABLET (20 MG TOTAL) BY MOUTH DAILY. 04/21/21   Gifford Shave, MD  medroxyPROGESTERone Acetate (DEPO-PROVERA IM) Inject into the muscle.    [provider]  ARIPiprazole (ABILIFY) 10 MG tablet Take 10 mg by mouth  daily. Patient not taking: Reported on 09/15/2019  03/20/20  [provider]    Family History Family History  Problem Relation Age of Onset   Healthy Mother    Healthy Father     Social History Social History   Tobacco Use   Smoking status: Never   Smokeless tobacco: Never  Vaping Use   Vaping Use: Never used  Substance Use Topics   Alcohol use: Yes    Comment: occasionally   Drug use: Yes    Types: Marijuana     Allergies   Patient has no known allergies.   Review of Systems Review of Systems  Constitutional:  Negative for activity change, appetite change, fatigue and fever.  Respiratory:  Negative for cough and shortness of breath.   Cardiovascular:  Negative for chest pain.  Gastrointestinal:  Positive for blood in stool. Negative for abdominal pain, constipation, diarrhea, nausea and vomiting.  Neurological:  Negative for dizziness, light-headedness and headaches.     Physical Exam Triage Vital Signs ED Triage Vitals  Enc Vitals Group     BP 06/12/21 1549 120/77     Pulse Rate 06/12/21 1549 85     Resp 06/12/21 1549 18     Temp 06/12/21 1549 98.9 F (37.2 C)     Temp src --      SpO2 06/12/21 1549 98 %     Weight --  Height --      Head Circumference --      Peak Flow --      Pain Score 06/12/21 1548 0     Pain Loc --      Pain Edu? --      Excl. in [place]? --    No data found.  Updated Vital Signs BP 120/77   Pulse 85   Temp 98.9 F (37.2 C)   Resp 18   SpO2 98%   Visual Acuity Right Eye Distance:   Left Eye Distance:   Bilateral Distance:    Right Eye Near:   Left Eye Near:    Bilateral Near:     Physical Exam Vitals reviewed.  Constitutional:      General: She is awake. She is not in acute distress.    Appearance: Normal appearance. She is well-developed. She is not ill-appearing.     Comments: Very pleasant female appears stated age in no acute distress sitting comfortably in exam room  HENT:     Head:  Normocephalic and atraumatic.  Cardiovascular:     Rate and Rhythm: Normal rate and regular rhythm.     Heart sounds: Normal heart sounds, S1 normal and S2 normal. No murmur heard. Pulmonary:     Effort: Pulmonary effort is normal.     Breath sounds: Normal breath sounds. No wheezing, rhonchi or rales.     Comments: Clear to auscultation bilaterally Abdominal:     General: Bowel sounds are normal.     Palpations: Abdomen is soft.     Tenderness: There is no abdominal tenderness. There is no right CVA tenderness, left CVA tenderness, guarding or rebound.     Comments: Benign abdominal exam  Genitourinary:    Rectum: Internal hemorrhoid present. No tenderness or external hemorrhoid.     Comments: Torrie, CMA present as chaperone during exam. Psychiatric:        Behavior: Behavior is cooperative.      UC Treatments / Results  Labs (all labs ordered are listed, but only abnormal results are displayed) Labs Reviewed - No data to display  EKG   Radiology No results found.  Procedures Procedures (including critical care time)  Medications Ordered in UC Medications - No data to display  Initial Impression / Assessment and Plan / UC Course  I have reviewed the triage vital signs and the nursing notes.  Pertinent labs & imaging results that were available during my care of the patient were reviewed by me and considered in my medical decision making (see chart for details).     Vital signs and physical exam reassuring today; no indication for emergent evaluation or imaging.  Patient is afebrile, nontoxic, nontachycardic so blood work was deferred.  Exam was normal.  Discussed that symptoms are likely related to hemorrhoids given clinical presentation.  Recommended she avoid constipation with increasing fiber and fluids in her diet.  We will treat with hydrocortisone suppositories.  Discussed that she should follow-up with a GI specialist and was given contact information for local  provider with instruction to call to schedule an appointment.  Discussed that if she has any worsening symptoms including recurrent bloody stool, abdominal pain, shortness of breath, palpitations, weakness she needs to be seen immediately to which she expressed understanding.  Strict return precautions given.  Work excuse note provided.  Final Clinical Impressions(s) / UC Diagnoses   Final diagnoses:  Internal hemorrhoid  Blood in stool     Discharge Instructions  I believe that you have a hemorrhoid causing your symptoms.  Use hydrocortisone suppositories twice daily.  Make sure that you avoid constipation by eating plenty of fiber and drinking fluids.  Follow-up with GI specialist; call to schedule an appointment.  If you have any recurrent bloody bowel movements, abdominal pain, nausea, vomiting, weakness, chest pain, shortness of breath, heart racing you need to be seen immediately.     ED Prescriptions     Medication Sig Dispense Auth. Provider   hydrocortisone (ANUSOL-HC) 25 MG suppository Place 1 suppository (25 mg total) rectally 2 (two) times daily. 12 suppository Khan Chura, Derry Skill, PA-C      PDMP not reviewed this encounter.   Terrilee Croak, PA-C 06/12/21 1629

## 2021-06-12 NOTE — ED Triage Notes (Addendum)
Pt is present today with bloody stool. Pt states that she noticed the blood in her stool this morning. Pt denies constipation but does have slight abdominal pain

## 2021-06-20 ENCOUNTER — Ambulatory Visit (INDEPENDENT_AMBULATORY_CARE_PROVIDER_SITE_OTHER): Payer: BC Managed Care – PPO

## 2021-06-20 ENCOUNTER — Ambulatory Visit: Payer: BC Managed Care – PPO

## 2021-06-20 DIAGNOSIS — Z3042 Encounter for surveillance of injectable contraceptive: Secondary | ICD-10-CM | POA: Diagnosis not present

## 2021-06-20 MED ORDER — MEDROXYPROGESTERONE ACETATE 150 MG/ML IM SUSP
150.0000 mg | Freq: Once | INTRAMUSCULAR | Status: AC
  Administered 2021-06-20: 150 mg via INTRAMUSCULAR

## 2021-06-20 NOTE — Progress Notes (Signed)
Patient here today for Depo Provera injection and is within her dates.    Last contraceptive appt was 10/20/2020  Depo given in LUOQ today.  Site unremarkable & patient tolerated injection.    Next injection due 9/5-9/19/23.  Reminder card given.    Veronda Prude, RN

## 2021-08-01 ENCOUNTER — Ambulatory Visit
Admission: RE | Admit: 2021-08-01 | Discharge: 2021-08-01 | Disposition: A | Discharge status: Home / Self Care | Payer: BC Managed Care – PPO | Source: Ambulatory Visit | Attending: Physician Assistant | Admitting: Physician Assistant

## 2021-08-01 ENCOUNTER — Other Ambulatory Visit: Payer: Self-pay

## 2021-08-01 ENCOUNTER — Ambulatory Visit (INDEPENDENT_AMBULATORY_CARE_PROVIDER_SITE_OTHER): Payer: BC Managed Care – PPO

## 2021-08-01 VITALS — BP 120/67 | HR 83 | Temp 99.1°F | Resp 18

## 2021-08-01 DIAGNOSIS — M79672 Pain in left foot: Secondary | ICD-10-CM

## 2021-08-01 MED ORDER — PREDNISONE 20 MG PO TABS: 40.0000 mg | ORAL_TABLET | Freq: Every day | ORAL | 0 refills | Status: AC

## 2021-08-01 NOTE — ED Provider Notes (Signed)
Emily Sellers    CSN: [id number] Arrival date & time: 08/01/21  1701      History   Chief Complaint Chief Complaint  Patient presents with   Foot Pain    Entered by patient    HPI Emily Sellers is a 26 y.o. female.   Patient is here today for evaluation of left foot pain and swelling that is been ongoing for the last week.  She reports initially she thought symptoms may be related to her work shoes as they are relatively flat however she changed her shoes and this has not been helpful.  She denies any numbness or tingling.  She has not had any injury that she is aware of.  She denies any known injury in the past.  The history is provided by the patient.  Foot Pain    Past Medical History:  Diagnosis Date   Anxiety    Depression     Patient Active Problem List   Diagnosis Date Noted   Vaginal itching 05/08/2021   Eye pain, right 01/15/2020   Depression, recurrent (HCC) 09/15/2019   Contraceptive management 03/23/2016    History reviewed. No pertinent surgical history.  OB History   No obstetric history on file.      Home Medications    Prior to Admission medications   Medication Sig Start Date End Date Taking? Authorizing Provider  predniSONE (DELTASONE) 20 MG tablet Take 2 tablets (40 mg total) by mouth daily with breakfast for 5 days. 08/01/21 08/06/21 Yes Tomi Bamberger, PA-C  FLUoxetine (PROZAC) 20 MG tablet TAKE 1 TABLET (20 MG TOTAL) BY MOUTH DAILY. 04/21/21   Cresenzo, Cyndi Lennert, MD  hydrocortisone (ANUSOL-HC) 25 MG suppository Place 1 suppository (25 mg total) rectally 2 (two) times daily. 06/12/21   Raspet, Noberto Retort, PA-C  medroxyPROGESTERone Acetate (DEPO-PROVERA IM) Inject into the muscle.    [provider]  ARIPiprazole (ABILIFY) 10 MG tablet Take 10 mg by mouth daily. Patient not taking: Reported on 09/15/2019  03/20/20  [provider]    Family History Family History  Problem Relation Age of Onset   Healthy Mother     Healthy Father     Social History Social History   Tobacco Use   Smoking status: Never   Smokeless tobacco: Never  Vaping Use   Vaping Use: Never used  Substance Use Topics   Alcohol use: Yes    Comment: occasionally   Drug use: Yes    Types: Marijuana     Allergies   Patient has no known allergies.   Review of Systems Review of Systems  Constitutional:  Negative for chills and fever.  Eyes:  Negative for discharge and redness.  Gastrointestinal:  Negative for nausea and vomiting.  Musculoskeletal:  Positive for arthralgias.  Skin:  Negative for color change.  Neurological:  Negative for numbness.     Physical Exam Triage Vital Signs ED Triage Vitals [08/01/21 1720]  Enc Vitals Group     BP 120/67     Pulse Rate 83     Resp 18     Temp 99.1 F (37.3 C)     Temp Source Oral     SpO2 99 %     Weight      Height      Head Circumference      Peak Flow      Pain Score 5     Pain Loc      Pain Edu?  Excl. in GC?    No data found.  Updated Vital Signs BP 120/67 (BP Location: Left Arm)   Pulse 83   Temp 99.1 F (37.3 C) (Oral)   Resp 18   SpO2 99%      Physical Exam Vitals and nursing note reviewed.  Constitutional:      General: She is not in acute distress.    Appearance: Normal appearance. She is not ill-appearing.  HENT:     Head: Normocephalic and atraumatic.  Eyes:     Conjunctiva/sclera: Conjunctivae normal.  Cardiovascular:     Rate and Rhythm: Normal rate.  Pulmonary:     Effort: Pulmonary effort is normal.  Musculoskeletal:     Comments: Full range of motion of left ankle and toes.  Mild diffuse swelling to lateral distal left foot at MTPs anteriorly.  Skin:    Comments: Normal coloration of left foot, no erythema, pallor or bruising  Neurological:     Mental Status: She is alert.     Comments: Gross sensation intact to left toes  Psychiatric:        Mood and Affect: Mood normal.        Behavior: Behavior normal.         Thought Content: Thought content normal.      UC Treatments / Results  Labs (all labs ordered are listed, but only abnormal results are displayed) Labs Reviewed - No data to display  EKG   Radiology DG Foot Complete Left  Result Date: 08/01/2021 CLINICAL DATA:  foot pain EXAM: LEFT FOOT - COMPLETE 3+ VIEW COMPARISON:  None Available. FINDINGS: There is no evidence of fracture or dislocation. There is no evidence of arthropathy or other focal bone abnormality. Soft tissues are unremarkable. IMPRESSION: Negative. Electronically Signed   By: Corlis Leak M.D.   On: 08/01/2021 17:39    Procedures Procedures (including critical Sellers time)  Medications Ordered in UC Medications - No data to display  Initial Impression / Assessment and Plan / UC Course  I have reviewed the triage vital signs and the nursing notes.  Pertinent labs & imaging results that were available during my Sellers of the patient were reviewed by me and considered in my medical decision making (see chart for details).    Xray with no fracture. Will treat with steroid burst and recommended more supportive shoes and follow up with ortho if no improvement over the next week or more. Patient expressed understanding.   Final Clinical Impressions(s) / UC Diagnoses   Final diagnoses:  Left foot pain   Discharge Instructions   None    ED Prescriptions     Medication Sig Dispense Auth. Provider   predniSONE (DELTASONE) 20 MG tablet Take 2 tablets (40 mg total) by mouth daily with breakfast for 5 days. 10 tablet Tomi Bamberger, PA-C      PDMP not reviewed this encounter.   Tomi Bamberger, PA-C 08/01/21 1910

## 2021-08-01 NOTE — ED Triage Notes (Signed)
Pt here with left foot pain and swelling x 1 week; pt denies obvious injury

## 2021-09-15 ENCOUNTER — Ambulatory Visit (INDEPENDENT_AMBULATORY_CARE_PROVIDER_SITE_OTHER): Payer: BC Managed Care – PPO

## 2021-09-15 DIAGNOSIS — Z3042 Encounter for surveillance of injectable contraceptive: Secondary | ICD-10-CM | POA: Diagnosis not present

## 2021-09-15 MED ORDER — MEDROXYPROGESTERONE ACETATE 150 MG/ML IM SUSP
150.0000 mg | Freq: Once | INTRAMUSCULAR | Status: AC
  Administered 2021-09-15: 150 mg via INTRAMUSCULAR

## 2021-09-15 NOTE — Progress Notes (Signed)
Patient here today for Depo Provera injection and is within her dates.    Last contraceptive appt was 10/20/2020. Patient scheduling follow up visit with PCP in October.   Depo given in RUOQ today.  Site unremarkable & patient tolerated injection.    Next injection due 12/1-12/15/23.  Reminder card given.    Veronda Prude, RN

## 2021-10-16 ENCOUNTER — Ambulatory Visit: Payer: BC Managed Care – PPO | Admitting: Student

## 2021-10-16 ENCOUNTER — Other Ambulatory Visit (HOSPITAL_COMMUNITY)
Admission: RE | Admit: 2021-10-16 | Discharge: 2021-10-16 | Disposition: A | Discharge status: Home / Self Care | Payer: BC Managed Care – PPO | Source: Ambulatory Visit | Attending: Family Medicine | Admitting: Family Medicine

## 2021-10-16 ENCOUNTER — Encounter: Payer: Self-pay | Admitting: Student

## 2021-10-16 VITALS — BP 129/72 | HR 83 | Ht 70.0 in | Wt 185.0 lb

## 2021-10-16 DIAGNOSIS — Z0000 Encounter for general adult medical examination without abnormal findings: Secondary | ICD-10-CM | POA: Diagnosis present

## 2021-10-16 DIAGNOSIS — N898 Other specified noninflammatory disorders of vagina: Secondary | ICD-10-CM | POA: Diagnosis not present

## 2021-10-16 MED ORDER — TRIAMCINOLONE ACETONIDE 0.5 % EX OINT: 1.0000 | TOPICAL_OINTMENT | Freq: Two times a day (BID) | CUTANEOUS | 0 refills | Status: DC

## 2021-10-16 NOTE — Assessment & Plan Note (Addendum)
Pap preformed today  Schedule Mirena IUD placement in 2 months

## 2021-10-16 NOTE — Patient Instructions (Signed)
It was great to see you today! Thank you for choosing [facility] for your primary care.   Today we addressed: Pap smear, I will either call or send you a Mychart message with your results.  Vaginal itching, I sent in an ointment you can use twice a day for 2 weeks. Let me know if this helps improve it.   You should return to our clinic Return in about 2 months (around 12/16/2021) for IUD placement .  I recommend that you always bring your medications to each appointment as this makes it easy to ensure you are on the correct medications and helps [place] not miss refills when you need them.  Please arrive 15 minutes before your appointment to ensure smooth check in process.  We appreciate your efforts in making this happen.  Please call the clinic at [phone] if your symptoms worsen or you have any concerns.  Thank you for allowing me to participate in your care, Dr. Sabra Heck

## 2021-10-16 NOTE — Assessment & Plan Note (Signed)
Try steroid cream for 2 weeks - probable lichen sclerosis

## 2021-10-16 NOTE — Progress Notes (Signed)
   Subjective:   Emily Sellers is a 26 y.o. female with a history of anxiety and depression here for an annual gynecological exam. She is having trouble with vaginal dryness and itchiness that has been present for several months. She has discomfort with intercourse. Denies new sexual partners or irregular discharge.    Gyn concerns/Preventative healthcare Last menstrual period: No LMP recorded. Patient has had an injection. Regular periods: no, depo shot  Heavy bleeding: no Sexually active: yes Contraception or hormonal therapy: yes, depo Hx of STD: Patient does not desire STD screening Dyspareunia: yes, dryness around vulva  Vaginal discharge: regular  Dysuria:No  Breast mass or concerns: No Last pap smear: October 2022, ASCUS w/ + HPV   PMH, Surgical Hx, Family Hx, Social History reviewed and updated as below.  Past Medical History:  Diagnosis Date   Anxiety    Depression    No past surgical history on file. Family History  Problem Relation Age of Onset   Healthy Mother    Healthy Father    Review of Systems:  Per HPI. Otherwise a complete 10 point ROS was negative.    Objective:   Vitals:   10/16/21 1337  BP: 129/72  Pulse: 83  SpO2: 100%   Exam: General: well appearing, NAD. HEENT: NCAT. Cardiovascular: RRR. No murmurs, rubs, or gallops. Respiratory: CTAB. No rales, rhonchi, or wheeze. Abdomen: soft, nontender, nondistended. Extremities: warm, well perfused. No LE edema. Skin: Warm, dry, intact. Neuro: No focal deficits.  Pelvic Exam:        External: normal female genitalia, bilateral erythema and dry skin patches on the vulva, no herpetic type lesions        Vagina: normal without lesions or masses        Cervix: normal without lesions or masses        Pap smear: performed        Samples for Wet prep obtained  Assessment/Plan:   Terrence was seen today for gynecologic exam.  Healthcare maintenance Assessment & Plan: Pap preformed today   Schedule Mirena IUD placement in 2 months  Orders: -     Cytology - PAP -     Cervicovaginal ancillary only  Vaginal itching Assessment & Plan: Try steroid cream for 2 weeks - probable lichen sclerosis  Orders: -     Triamcinolone Acetonide; Apply 1 Application topically 2 (two) times daily.  Dispense: 30 g; Refill: 0

## 2021-10-17 LAB — CERVICOVAGINAL ANCILLARY ONLY
Bacterial Vaginitis (gardnerella): NEGATIVE
Candida Glabrata: NEGATIVE
Candida Vaginitis: POSITIVE — AB
Comment: NEGATIVE
Comment: NEGATIVE
Comment: NEGATIVE

## 2021-10-18 ENCOUNTER — Telehealth: Payer: Self-pay | Admitting: Student

## 2021-10-18 DIAGNOSIS — N898 Other specified noninflammatory disorders of vagina: Secondary | ICD-10-CM

## 2021-10-18 MED ORDER — FLUCONAZOLE 150 MG PO TABS
150.0000 mg | ORAL_TABLET | Freq: Once | ORAL | 0 refills | Status: AC
Start: 2021-10-18 — End: 2021-10-18

## 2021-10-18 NOTE — Telephone Encounter (Signed)
Spoke with patient and discussed results of vaginal swab: positive yeast.  Sent in a prescription for one-time dose of fluconazole 150 mg.  Darci Current, DO [place], PGY-1 10/18/21 12:16 PM

## 2021-10-19 ENCOUNTER — Other Ambulatory Visit: Payer: Self-pay | Admitting: Family Medicine

## 2021-10-19 LAB — CYTOLOGY - PAP
Comment: NEGATIVE
Diagnosis: NEGATIVE
High risk HPV: POSITIVE — AB

## 2021-12-08 ENCOUNTER — Ambulatory Visit: Payer: BC Managed Care – PPO | Admitting: Student

## 2021-12-08 ENCOUNTER — Encounter: Payer: Self-pay | Admitting: Student

## 2021-12-08 VITALS — BP 128/79 | HR 92 | Ht 70.0 in | Wt 187.2 lb

## 2021-12-08 DIAGNOSIS — Z3009 Encounter for other general counseling and advice on contraception: Secondary | ICD-10-CM

## 2021-12-08 DIAGNOSIS — Z975 Presence of (intrauterine) contraceptive device: Secondary | ICD-10-CM | POA: Diagnosis not present

## 2021-12-08 DIAGNOSIS — Z30430 Encounter for insertion of intrauterine contraceptive device: Secondary | ICD-10-CM | POA: Diagnosis not present

## 2021-12-08 NOTE — Progress Notes (Signed)
    SUBJECTIVE:   CHIEF COMPLAINT / HPI: Patient would like IUD placed today  She last got her Depo shot on 09/15/2021.  She would like to have Mirena IUD in place.  We discussed pros and cons and complications.  Patient took 600 mg ibuprofen before procedure today.  PERTINENT  PMH / PSH: Noncontributory  OBJECTIVE:   BP 128/79   Pulse 92   Ht 5\' 10"  (1.778 m)   Wt 187 lb 3.2 oz (84.9 kg)   SpO2 100%   BMI 26.86 kg/m   PROCEDURE NOTE: IUD INSERTION    IUD INSERTION: Sterile technique maintained. Cervix cleansed three times with betadine swabs.  A tenaculum was placed into the anterior lip of the cervix and a uterine sound was used to measure uterine size which measured around 7 cm. A Mirena Lot# [id number] IUD was placed into the endometrial cavity, deployed and secured. The applicator was removed. The strings were trimmed to 3 centimeters.  All equipment was removed and accounted for.There were no complications and the patient tolerated the procedure well.   She was given handouts for post procedure instructions and information about the IUD including a card with the time of recommended removal.   ASSESSMENT/PLAN:   IUD (intrauterine device) in place  Mirena card given to patient. Discussed bleeding precautions.  Advised alternating Tylenol and ibuprofen every 3 hours today.   , MD [facility]

## 2021-12-08 NOTE — Assessment & Plan Note (Addendum)
Mirena card given to patient. Discussed bleeding precautions.  Advised alternating Tylenol and ibuprofen every 3 hours today.

## 2021-12-08 NOTE — Patient Instructions (Addendum)
It was great to see you! Thank you for allowing me to participate in your care!   Our plans for today:  - You have your Mirena IUD in place! You did great! - If you feel the strings are too long/poking please come back - Alternate tylenol and ibuprofen every 3 hours to help with the cramping pain - I would schedule a 1 month visit for string check   Take care and seek immediate care sooner if you develop any concerns.  Levin Erp, MD

## 2021-12-11 MED ORDER — LEVONORGESTREL 20 MCG/DAY IU IUD
1.0000 | INTRAUTERINE_SYSTEM | Freq: Once | INTRAUTERINE | Status: AC
  Administered 2021-12-08: 1 via INTRAUTERINE

## 2021-12-11 NOTE — Addendum Note (Signed)
Addended by: Veronda Prude on: 12/11/2021 03:44 PM   Modules accepted: Orders

## 2021-12-12 ENCOUNTER — Encounter: Payer: Self-pay | Admitting: Student

## 2022-01-10 ENCOUNTER — Encounter: Payer: Self-pay | Admitting: Student

## 2022-01-10 ENCOUNTER — Ambulatory Visit: Payer: BC Managed Care – PPO | Admitting: Student

## 2022-01-10 VITALS — BP 114/71 | HR 73 | Ht 70.0 in | Wt 188.4 lb

## 2022-01-10 DIAGNOSIS — R61 Generalized hyperhidrosis: Secondary | ICD-10-CM | POA: Diagnosis not present

## 2022-01-10 MED ORDER — DRYSOL 20 % EX SOLN: Freq: Every day | CUTANEOUS | 0 refills | Status: DC

## 2022-01-10 NOTE — Patient Instructions (Signed)
It was great to see you! Thank you for allowing me to participate in your care!   Our plans for today:  - I am not sure what is causing your excessive sweating in your groin area - but I am going to prescribe a prescription strength antiperspirant called drysol to place nightly to see if this helps -Your strings look in place!  Take care and seek immediate care sooner if you develop any concerns.  Gerrit Heck, MD

## 2022-01-10 NOTE — Progress Notes (Signed)
    SUBJECTIVE:   CHIEF COMPLAINT / HPI:   IUD string check Mirena placed about a month ago.  Patient has been doing well denies any vaginal bleeding or spotting.  She has not felt her strings but has not had any discomfort.  She says that she has been having excessive groin perspiration that started prior to her IUD being placed but worsened with Mirena placement.  Denies any perspiration anywhere else.  She has not been putting anything on top of this.  She denies any vaginal discharge or discomfort.  PERTINENT  PMH / PSH: None relevant  OBJECTIVE:   BP 114/71   Pulse 73   Ht 5\' 10"  (1.778 m)   Wt 188 lb 6.4 oz (85.5 kg)   SpO2 99%   BMI 27.03 kg/m    General: NAD, pleasant, able to participate in exam Respiratory: Normal effort, no obvious respiratory distress Pelvic: VULVA: normal appearing vulva with no masses, tenderness or lesions, VAGINA: Normal appearing vagina with normal color, no lesions, with scant discharge present, CERVIX: No lesions, 2 IUD strings visualized on examination  Chaperone Glori Bickers CMA present for pelvic exam  ASSESSMENT/PLAN:   Assessment:  27 y.o. female here for IUD string check.  IUD seems to be in place.  Patient has been having hyperhidrosis of her inguinal area for the past month that has been increasing.  Unclear etiology but will send in Drysol for symptomatic management.  Plan: -Drysol sent to pharmacy -Follow up if not improving  Gerrit Heck, MD [place]

## 2022-03-23 ENCOUNTER — Ambulatory Visit
Admission: RE | Admit: 2022-03-23 | Discharge: 2022-03-23 | Disposition: A | Discharge status: Home / Self Care | Payer: BC Managed Care – PPO | Source: Ambulatory Visit | Attending: Internal Medicine | Admitting: Internal Medicine

## 2022-03-23 VITALS — BP 110/66 | HR 95 | Temp 98.2°F | Resp 19

## 2022-03-23 DIAGNOSIS — U071 COVID-19: Secondary | ICD-10-CM | POA: Insufficient documentation

## 2022-03-23 LAB — SARS CORONAVIRUS 2 (TAT 6-24 HRS): SARS Coronavirus 2: POSITIVE — AB

## 2022-03-23 NOTE — ED Triage Notes (Addendum)
Pt presents to uc with co of covid pt reports she was positive at home but her job requires a in person test for time off. Symptoms started Wednesday. She has not been taking any medication

## 2022-03-23 NOTE — ED Provider Notes (Signed)
Emily Sellers    CSN: [id number] Arrival date & time: 03/23/22  0846      History   Chief Complaint Chief Complaint  Patient presents with   covid    HPI Emily Sellers is a 27 y.o. female.   Patient presents for further evaluation after testing positive for COVID-19 with an at home test last night.  Patient reports that symptoms started about 4 days ago and include nasal congestion and cough.  She denies any fever.  Reports that she has been exposed to COVID-19 from a friend at her workplace.  She has not taken any medications for symptoms.  Denies history of asthma or COPD and patient does not smoke cigarettes.  Reports that she is here today to have a repeat COVID testing given workplace requirement.     Past Medical History:  Diagnosis Date   Anxiety    Depression     Patient Active Problem List   Diagnosis Date Noted   IUD (intrauterine device) in place 12/08/2021   Vaginal itching 05/08/2021   Depression, recurrent ([place]) 09/15/2019   Contraception management 03/23/2016    History reviewed. No pertinent surgical history.  OB History   No obstetric history on file.      Home Medications    Prior to Admission medications   Medication Sig Start Date End Date Taking? Authorizing Provider  aluminum chloride (DRYSOL) 20 % external solution Apply topically at bedtime. 01/10/22   Gerrit Heck, MD  FLUoxetine (PROZAC) 20 MG tablet TAKE 1 TABLET (20 MG TOTAL) BY MOUTH DAILY. 10/19/21   Darci Current, DO  triamcinolone ointment (KENALOG) 0.5 % Apply 1 Application topically 2 (two) times daily. 10/16/21   Darci Current, DO  ARIPiprazole (ABILIFY) 10 MG tablet Take 10 mg by mouth daily. Patient not taking: Reported on 09/15/2019  03/20/20  [provider]    Family History Family History  Problem Relation Age of Onset   Healthy Mother    Healthy Father     Social History Social History   Tobacco Use   Smoking status: Never    Smokeless tobacco: Never  Vaping Use   Vaping Use: Never used  Substance Use Topics   Alcohol use: Yes    Comment: occasionally   Drug use: Yes    Types: Marijuana     Allergies   Patient has no known allergies.   Review of Systems Review of Systems Per HPI  Physical Exam Triage Vital Signs ED Triage Vitals  Enc Vitals Group     BP 03/23/22 0905 110/66     Pulse Rate 03/23/22 0905 95     Resp 03/23/22 0905 19     Temp 03/23/22 0905 98.2 F (36.8 C)     Temp src --      SpO2 03/23/22 0905 96 %     Weight --      Height --      Head Circumference --      Peak Flow --      Pain Score 03/23/22 0904 0     Pain Loc --      Pain Edu? --      Excl. in [place]? --    No data found.  Updated Vital Signs BP 110/66   Pulse 95   Temp 98.2 F (36.8 C)   Resp 19   SpO2 96%   Visual Acuity Right Eye Distance:   Left Eye Distance:   Bilateral Distance:    Right  Eye Near:   Left Eye Near:    Bilateral Near:     Physical Exam Constitutional:      General: She is not in acute distress.    Appearance: Normal appearance. She is not toxic-appearing or diaphoretic.  HENT:     Head: Normocephalic and atraumatic.     Right Ear: Tympanic membrane and ear canal normal.     Left Ear: Tympanic membrane and ear canal normal.     Nose: Congestion present.     Mouth/Throat:     Mouth: Mucous membranes are moist.     Pharynx: No posterior oropharyngeal erythema.  Eyes:     Extraocular Movements: Extraocular movements intact.     Conjunctiva/sclera: Conjunctivae normal.     Pupils: Pupils are equal, round, and reactive to light.  Cardiovascular:     Rate and Rhythm: Normal rate and regular rhythm.     Pulses: Normal pulses.     Heart sounds: Normal heart sounds.  Pulmonary:     Effort: Pulmonary effort is normal. No respiratory distress.     Breath sounds: Normal breath sounds. No stridor. No wheezing, rhonchi or rales.  Abdominal:     General: Abdomen is flat. Bowel sounds  are normal.     Palpations: Abdomen is soft.  Musculoskeletal:        General: Normal range of motion.     Cervical back: Normal range of motion.  Skin:    General: Skin is warm and dry.  Neurological:     General: No focal deficit present.     Mental Status: She is alert and oriented to person, place, and time. Mental status is at baseline.  Psychiatric:        Mood and Affect: Mood normal.        Behavior: Behavior normal.      UC Treatments / Results  Labs (all labs ordered are listed, but only abnormal results are displayed) Labs Reviewed  SARS CORONAVIRUS 2 (TAT 6-24 HRS)    EKG   Radiology No results found.  Procedures Procedures (including critical Sellers time)  Medications Ordered in UC Medications - No data to display  Initial Impression / Assessment and Plan / UC Course  I have reviewed the triage vital signs and the nursing notes.  Pertinent labs & imaging results that were available during my Sellers of the patient were reviewed by me and considered in my medical decision making (see chart for details).     Patient tested positive for COVID-19 with an at home test and is requesting COVID PCR today given workplace requirement.  Therefore, COVID PCR is pending.  Patient declined COVID antivirals with shared decision making.  Advised supportive Sellers and symptom management.  Advised quarantine per [employer] guidelines.  Advised strict follow-up precautions.  Patient verbalized understanding and was agreeable with plan. Final Clinical Impressions(s) / UC Diagnoses   Final diagnoses:  [id number]     Discharge Instructions      Covid test is pending.  Will call if it is positive.     ED Prescriptions   None    PDMP not reviewed this encounter.   Teodora Medici, [place] 03/23/22 [phone]

## 2022-03-23 NOTE — Discharge Instructions (Signed)
Covid test is pending.  Will call if it is positive.

## 2022-03-29 ENCOUNTER — Other Ambulatory Visit: Payer: Self-pay | Admitting: Student

## 2022-04-03 ENCOUNTER — Telehealth (INDEPENDENT_AMBULATORY_CARE_PROVIDER_SITE_OTHER): Payer: BC Managed Care – PPO | Admitting: Family Medicine

## 2022-04-03 DIAGNOSIS — N949 Unspecified condition associated with female genital organs and menstrual cycle: Secondary | ICD-10-CM

## 2022-04-03 NOTE — Progress Notes (Unsigned)
[facility] Visit  Patient consented to have virtual visit and was identified by name and date of birth. Method of visit: Video  Encounter participants: Patient: Emily Sellers - located in car Provider: Alcus Dad - located at [facility] Others (if applicable): none  Chief Complaint: Discuss possible IUD removal  HPI:  Patient reports she gets "random sharp pains" in her lower pelvis and vagina, almost into her rectum area. Occurs every ~2 days and occurs multiple times throughout the day. Also described as cramping. Takes ibuprofen which helps but she's taking a lot of Ibuprofen. Symptoms started after she got her IUD placed (~4 months ago). Wondering if it's related and if she should get it removed.  No periods since her IUD was placed. Denies constipation or other bowel changes. No dysuria, no vaginal discharge.  Pertinent PMHx: HRHPV  Exam:  There were no vitals taken for this visit.  General: well-appearing, NAD Respiratory: normal effort, speaking in full sentences Psych: appropriate mood and affect  Assessment/Plan:  Vaginal discomfort Unclear etiology of patient's symptoms. Advised she come in for pelvic exam- scheduled for next week with myself. Depending on that visit we could consider IUD removal although discussed with patient there is a chance her symptoms will be unchanged despite IUD removal. She will think about it and we will discuss further at follow-up.    Time spent during visit with patient: 20 minutes

## 2022-04-03 NOTE — Patient Instructions (Signed)
  Your abdominal/vaginal pain could be several different things. I have scheduled you an office visit on April 8th at 8:30am for a pelvic exam and to discuss further.  Depending on that visit we can consider removing your IUD.   -Dr Rock Nephew

## 2022-04-05 DIAGNOSIS — R102 Pelvic and perineal pain unspecified side: Secondary | ICD-10-CM | POA: Insufficient documentation

## 2022-04-05 DIAGNOSIS — N949 Unspecified condition associated with female genital organs and menstrual cycle: Secondary | ICD-10-CM | POA: Insufficient documentation

## 2022-04-05 NOTE — Assessment & Plan Note (Signed)
>>  ASSESSMENT AND PLAN FOR VAGINAL DISCOMFORT WRITTEN ON 04/05/2022 12:52 PM BY Maury DusWELLS, Reason Helzer, MD  Unclear etiology of patient's symptoms. Advised she come in for pelvic exam- scheduled for next week with myself. Depending on that visit we could consider IUD removal although discussed with patient there is a chance her symptoms will be unchanged despite IUD removal. She will think about it and we will discuss further at follow-up.

## 2022-04-05 NOTE — Assessment & Plan Note (Signed)
Unclear etiology of patient's symptoms. Advised she come in for pelvic exam- scheduled for next week with myself. Depending on that visit we could consider IUD removal although discussed with patient there is a chance her symptoms will be unchanged despite IUD removal. She will think about it and we will discuss further at follow-up.

## 2022-04-09 ENCOUNTER — Ambulatory Visit (INDEPENDENT_AMBULATORY_CARE_PROVIDER_SITE_OTHER): Payer: BC Managed Care – PPO | Admitting: Family Medicine

## 2022-04-09 ENCOUNTER — Other Ambulatory Visit (HOSPITAL_COMMUNITY)
Admission: RE | Admit: 2022-04-09 | Discharge: 2022-04-09 | Disposition: A | Discharge status: Home / Self Care | Payer: BC Managed Care – PPO | Source: Ambulatory Visit | Attending: Family Medicine | Admitting: Family Medicine

## 2022-04-09 VITALS — BP 114/80 | HR 70 | Ht 71.0 in | Wt 195.0 lb

## 2022-04-09 DIAGNOSIS — R102 Pelvic and perineal pain: Secondary | ICD-10-CM

## 2022-04-09 LAB — POCT WET PREP (WET MOUNT)
Clue Cells Wet Prep Whiff POC: NEGATIVE
Trichomonas Wet Prep HPF POC: ABSENT

## 2022-04-09 MED ORDER — DILTIAZEM GEL 2 %
1.0000 | CUTANEOUS | 0 refills | Status: DC | PRN
Start: 2022-04-09 — End: 2023-05-03

## 2022-04-09 MED ORDER — FLUCONAZOLE 150 MG PO TABS: 150.0000 mg | ORAL_TABLET | Freq: Once | ORAL | 0 refills | Status: AC

## 2022-04-09 NOTE — Progress Notes (Signed)
    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 Small superficial abrasion on right inferior labia majora. Remainder of external genitalia within normal limits.  Vaginal mucosa pink, moist, normal rugae. White discharge noted on speculum exam. Nonfriable cervix without lesions, no bleeding noted. IUD strings not visualized. Bimanual exam revealed normal, nongravid uterus.  No cervical motion tenderness. No adnexal masses bilaterally.    ASSESSMENT/PLAN:   Pelvic pain 5 months of daily pelvic and perineal cramping/spasm. Unclear etiology. Wet prep positive for yeast today but doubt this is contributing significantly to her symptoms. No CMT on pelvic exam so unlikely PID but will f/u results of GC/chlamydia testing. 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 F/u in 1 month. If no improvement, patient desires trial of IUD removal. Will check pelvic ultrasound as strings not visualized on exam today.    Emily Dus, MD [facility]

## 2022-04-09 NOTE — Patient Instructions (Addendum)
You have a yeast infection. I have sent a medication to your pharmacy.  While this is probably causing your vaginal itching, it is unlikely this is the cause of your pelvic spasms.  We also did routine testing for sexually transmitted infections. I will send you a MyChart message in approximately 2 days with the results.  We will also check a pelvic ultrasound. This can help evaluate your pain but also confirm the location of your IUD if we decide to remove it in the future.  Your symptoms may be "proctalgia fugax" which are essentially rectal spasms. There are some options to help with this including pelvic floor PT and topical medications. I will send a cream to the pharmacy for you to use at the onset of your spasm.   Follow up in 1 month.  Take care, Dr Anner Crete

## 2022-04-09 NOTE — Assessment & Plan Note (Addendum)
5 months of daily pelvic and perineal cramping/spasm. Unclear etiology. Wet prep positive for yeast today but doubt this is contributing significantly to her symptoms. No CMT on pelvic exam so unlikely PID but will f/u results of GC/chlamydia testing. 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 F/u in 1 month. If no improvement, patient desires trial of IUD removal. Will check pelvic ultrasound as strings not visualized on exam today.

## 2022-04-10 LAB — CERVICOVAGINAL ANCILLARY ONLY
Bacterial Vaginitis (gardnerella): NEGATIVE
Candida Glabrata: NEGATIVE
Candida Vaginitis: POSITIVE — AB
Chlamydia: NEGATIVE
Comment: NEGATIVE
Comment: NEGATIVE
Comment: NEGATIVE
Comment: NEGATIVE
Comment: NEGATIVE
Comment: NORMAL
Neisseria Gonorrhea: NEGATIVE
Trichomonas: NEGATIVE

## 2022-04-13 ENCOUNTER — Telehealth: Payer: Self-pay

## 2022-04-13 DIAGNOSIS — R102 Pelvic and perineal pain: Secondary | ICD-10-CM

## 2022-04-13 NOTE — Telephone Encounter (Signed)
Patient calls nurse line in regards to Ultrasound.   She reports her copay is ~1000 dollars and she can not afford this.   I spoke with Jazmin and [facility] tends to be cheaper than [facility].   Please update order to [facility].

## 2022-04-13 NOTE — Telephone Encounter (Signed)
Order updated to [facility].   Maury Dus, MD PGY-3, [facility]

## 2022-04-16 NOTE — Telephone Encounter (Signed)
Patient reports she is going to continue with apt scheduled for tomorrow at [facility].   She reports she already has the time off of work and reports they will set up a payment plan for her.

## 2022-04-17 ENCOUNTER — Ambulatory Visit (HOSPITAL_COMMUNITY)
Admission: RE | Admit: 2022-04-17 | Discharge: 2022-04-17 | Disposition: A | Discharge status: Home / Self Care | Payer: BC Managed Care – PPO | Source: Ambulatory Visit | Attending: Family Medicine | Admitting: Family Medicine

## 2022-04-17 DIAGNOSIS — R102 Pelvic and perineal pain: Secondary | ICD-10-CM | POA: Diagnosis present

## 2022-04-20 ENCOUNTER — Ambulatory Visit: Payer: BC Managed Care – PPO | Admitting: Family Medicine

## 2022-04-20 ENCOUNTER — Encounter: Payer: Self-pay | Admitting: Family Medicine

## 2022-04-20 VITALS — BP 135/78 | HR 68 | Ht 71.0 in | Wt 195.8 lb

## 2022-04-20 DIAGNOSIS — R102 Pelvic and perineal pain: Secondary | ICD-10-CM

## 2022-04-20 DIAGNOSIS — Z30432 Encounter for removal of intrauterine contraceptive device: Secondary | ICD-10-CM | POA: Diagnosis not present

## 2022-04-20 NOTE — Patient Instructions (Addendum)
It was great to see you!  Today we removed your IUD. You may have some cramping and spotting in the next few days.  You have no active form of birth control at this time. IF you are sexually active, you must use condoms to prevent pregnancy.  If/when you decide you're interested in an alternate form of contraception, please let us know right away. Bedsider.org is an excellent resource to read more and compare different options.  If your pelvic pain does not improve in the next few months, let us know.   Take care, Dr Anner Crete

## 2022-04-20 NOTE — Progress Notes (Unsigned)
    SUBJECTIVE:   CHIEF COMPLAINT / HPI:   Pelvic Pain, IUD Removal Patient here for IUD removal. Has been having pelvic pain ever since it was placed 4 months ago.  Had pelvic ultrasound which was unremarkable. Was given Rx for diltiazem gel for possible proctalgia fugax but hasn't picked this up yet. She feels like pain is more in her pelvis than rectum and would like her IUD removed. Does not want any form of contraception at this time.  PERTINENT  PMH / PSH: reviewed, none pertinent  OBJECTIVE:   BP 135/78   Pulse 68   Ht  (1.803 m)   Wt 195 lb 12.8 oz (88.8 kg)   SpO2 100%   BMI 27.31 kg/m   General: NAD, pleasant, able to participate in exam Respiratory: No respiratory distress Skin: warm and dry, no rashes noted Psych: Normal affect and mood Neuro: grossly intact GI: abdomen soft, NTND GU/GYN: Exam performed in the presence of a chaperone. External genitalia within normal limits.  Vaginal mucosa pink, moist, normal rugae.  Nonfriable cervix without lesions, no discharge or bleeding noted on speculum exam.  Bimanual exam performed at last office visit and was unremarkable.   ASSESSMENT/PLAN:   Pelvic pain Present for 4 months. Etiology unclear. She recently had unremarkable pelvic ultrasound and negative STI testing. Desires IUD removal. We discussed risks/benefits of IUD removal including the possibility that it will not improve her pain and patient wishes to proceed. Also discussed alternate forms of contraception and patient chooses barrier protection.   IUD Removal:  Device Removed: Mirena Indication for Removal:  Desire for fertility  Device at/past expiration date  Menopause  Adverse Side Effect: persistent pelvic cramping  A speculum was placed and the IUD string visualized. Using ringed forceps, the IUD string was grasped and the device was removed without difficulty. Bleeding was minimal.  The patient tolerated the procedure well without  complications. Standard post-procedure care was explained and return precautions given. It was discussed that fertility will return quickly following removal, and that other forms of contraception should be used if pregnancy is not desired.   Maury Dus, MD [facility]

## 2022-04-21 NOTE — Assessment & Plan Note (Signed)
Present for 4 months. Etiology unclear. She recently had unremarkable pelvic ultrasound and negative STI testing. Desires IUD removal. We discussed risks/benefits of IUD removal including the possibility that it will not improve her pain and patient wishes to proceed. Also discussed alternate forms of contraception and patient chooses barrier protection.

## 2022-06-22 ENCOUNTER — Encounter: Payer: Self-pay | Admitting: Family Medicine

## 2022-06-22 ENCOUNTER — Ambulatory Visit: Payer: BC Managed Care – PPO | Admitting: Family Medicine

## 2022-06-22 VITALS — BP 122/78 | HR 97 | Ht 71.0 in | Wt 187.4 lb

## 2022-06-22 DIAGNOSIS — N949 Unspecified condition associated with female genital organs and menstrual cycle: Secondary | ICD-10-CM | POA: Diagnosis not present

## 2022-06-22 NOTE — Patient Instructions (Signed)
It was great seeing you today!  Today we discussed the lump, it seems that this healed very quickly because I can barely see the affected area anymore. If this occurs again, apply a warm compress to the area.  Please avoid using an scented products in that area as it is a very sensitive area.  If you start to experience pain, vaginal discharge, foul odor or the area seems to grow larger with drainage that appears yellow then please return back for another visit.   Please follow up at your next scheduled appointment, if anything arises between now and then, please don't hesitate to contact our office.   Thank you for allowing [place] to be a part of your medical care!  Thank you, Dr. Robyne Peers

## 2022-06-22 NOTE — Assessment & Plan Note (Addendum)
-  GU exam unremarkable, seems to be a very small area barely noticeable and healed.  -advised to apply warm compress if this occurs against -advised against using scented products or strong soaps in that sensitive area to prevent further irritation -continue barrier protection -reassurance provided  -strict return precautions discussed  -follow up as appropriate

## 2022-06-22 NOTE — Progress Notes (Signed)
    SUBJECTIVE:   CHIEF COMPLAINT / HPI:   Patient presents for concern of lump on her vagina that she first noticed this morning. She woke up and was getting in the shower when she noticed the area a little red. She has not shaved in at least a month. Denies pain. When she squeezed it, she noticed little clear drainage with some bleeding. Sexually active and using barrier protection, she was on contraception for the last 7 years but needed a break from it. Uses protection with each encounter without fail. Denies any new partners. Denies fever, chills, abdominal pain, pelvic pain, vaginal discharge and dysuria. She has never had anything like this in the past. It has gotten smaller since she first noticed it this morning and the redness has improved as well but still wanted to get it checked out to make sure.  OBJECTIVE:   BP 122/78   Pulse 97   Ht 5\' 11"  (1.803 m)   Wt 187 lb 6.4 oz (85 kg)   LMP 05/23/2022   SpO2 100%   BMI 26.14 kg/m   General: Patient well-appearing, in no acute distress. Resp: normal work of breathing noted GU: normal labia, normal vagina, no rashes or external lesions noted, no erythema noted, small circular macular skin-colored area less than 0.1 cm on the top right of the labia that appears to be healing, no abnormal pigmentation noted, no fluctuant masses noted, no drainage or bleeding noted  GU exam performed in the presence of chaperone, Cleatrice Burke, CMA.  ASSESSMENT/PLAN:   Vaginal lump -GU exam unremarkable, seems to be a very small area barely noticeable and healed.  -advised to apply warm compress if this occurs against -advised against using scented products or strong soaps in that sensitive area to prevent further irritation -continue barrier protection -reassurance provided  -strict return precautions discussed  -follow up as appropriate     Levern Kalka Robyne Peers, DO [facility]

## 2022-10-04 ENCOUNTER — Other Ambulatory Visit: Payer: Self-pay | Admitting: Student

## 2022-11-05 ENCOUNTER — Other Ambulatory Visit (HOSPITAL_COMMUNITY)
Admission: RE | Admit: 2022-11-05 | Discharge: 2022-11-05 | Disposition: A | Discharge status: Home / Self Care | Payer: BC Managed Care – PPO | Source: Ambulatory Visit | Attending: Family Medicine | Admitting: Family Medicine

## 2022-11-05 ENCOUNTER — Other Ambulatory Visit: Payer: Self-pay | Admitting: Family Medicine

## 2022-11-05 ENCOUNTER — Ambulatory Visit (INDEPENDENT_AMBULATORY_CARE_PROVIDER_SITE_OTHER): Payer: BC Managed Care – PPO | Admitting: Student

## 2022-11-05 VITALS — BP 98/64 | HR 98 | Wt 180.0 lb

## 2022-11-05 DIAGNOSIS — Z124 Encounter for screening for malignant neoplasm of cervix: Secondary | ICD-10-CM | POA: Insufficient documentation

## 2022-11-05 DIAGNOSIS — Z113 Encounter for screening for infections with a predominantly sexual mode of transmission: Secondary | ICD-10-CM | POA: Diagnosis not present

## 2022-11-05 DIAGNOSIS — Z0000 Encounter for general adult medical examination without abnormal findings: Secondary | ICD-10-CM

## 2022-11-05 NOTE — Patient Instructions (Signed)
Here's a guide to how much exercise the body needs. These should be minutes of dedicated moderate-difficulty cardiovascular exercise. Hiking, jogging, swimming, biking, rucking, or pilates are all good options. As you're starting out, a brisk walk is likely enough of a stress on your body, but as you adapt, the exercises should become more challenging.  150 min/week to maintain and improve cardiovascular health 150-250 min/week to prevent weight gain 225-420 min/week (1 hr/day!) to promote meaningful weight loss 200-300 min/week to prevent recurrent weight gain after loss   In addition, resistance exercises such as working out with resistance bands/weights/weight machines or calisthenics is a MUST. These exercises should be done twice weekly at minimum and the weights should get heavier with time.

## 2022-11-05 NOTE — Progress Notes (Signed)
    SUBJECTIVE:   Chief compliant/HPI: annual examination  Emily Sellers is a 27 y.o. who presents today for an annual exam. Here because she was told to return for repeat Pap in 1 year due to +HPV on her last Pap.   Review of systems form notable for none, feels well.   Updated history tabs and problem list.   OBJECTIVE:   BP 98/64   Pulse 98   Wt 180 lb (81.6 kg)   LMP 10/22/2022   SpO2 98%   BMI 25.10 kg/m   Physical Exam Vitals reviewed. Exam conducted with a chaperone present.  Constitutional:      General: She is not in acute distress. Cardiovascular:     Rate and Rhythm: Normal rate and regular rhythm.     Heart sounds: No murmur heard. Pulmonary:     Effort: Pulmonary effort is normal.     Breath sounds: No wheezing, rhonchi or rales.  Genitourinary:    General: Normal vulva.     Vagina: Normal. No vaginal discharge.     Cervix: Normal. No friability, lesion or cervical bleeding.  Skin:    Findings: No rash.      ASSESSMENT/PLAN:   Annual Examination  See AVS for age appropriate recommendations.   PHQ score 2, reviewed and discussed. Blood pressure reviewed and at goal.  The patient currently uses nothing for contraception--is in a monogamous sexual relationship with a trans female who cannot get her pregnant.  Previously had an IUD but this was removed in April.    Considered the following items based upon USPSTF recommendations: HIV testing: ordered Hepatitis C: ordered Syphilis if at high risk: ordered GC/CT  ordered Lipid panel (nonfasting or fasting) discussed based upon AHA recommendations and not ordered.  Consider repeat every 4-6 years.   Discussed family history, no indication for early colon or breast CA screening. + family history of Lung CA, patient previously smoked but quit 3 weeks ago. Congratulations offered. Cervical cancer screening: due for Pap today, cytology + HPV ordered Immunizations: declines all immunizations today.     Follow up in 1 year or sooner if indicated.    Eliezer Mccoy, MD [facility]

## 2022-11-06 LAB — HCV INTERPRETATION

## 2022-11-06 LAB — HCV AB W REFLEX TO QUANT PCR: HCV Ab: NONREACTIVE

## 2022-11-06 LAB — HIV ANTIBODY (ROUTINE TESTING W REFLEX): HIV Screen 4th Generation wRfx: NONREACTIVE

## 2022-11-06 LAB — RPR: RPR Ser Ql: NONREACTIVE

## 2022-11-12 LAB — CYTOLOGY - PAP
Chlamydia: NEGATIVE
Comment: NEGATIVE
Comment: NEGATIVE
Comment: NEGATIVE
Comment: NORMAL
Diagnosis: UNDETERMINED — AB
High risk HPV: NEGATIVE
Neisseria Gonorrhea: NEGATIVE
Trichomonas: NEGATIVE

## 2023-05-03 ENCOUNTER — Ambulatory Visit (INDEPENDENT_AMBULATORY_CARE_PROVIDER_SITE_OTHER): Admitting: Family Medicine

## 2023-05-03 ENCOUNTER — Encounter: Payer: Self-pay | Admitting: Family Medicine

## 2023-05-03 ENCOUNTER — Other Ambulatory Visit (HOSPITAL_COMMUNITY)
Admission: RE | Admit: 2023-05-03 | Discharge: 2023-05-03 | Disposition: A | Discharge status: Home / Self Care | Source: Ambulatory Visit | Attending: Family Medicine | Admitting: Family Medicine

## 2023-05-03 VITALS — BP 102/72 | HR 99 | Wt 175.0 lb

## 2023-05-03 DIAGNOSIS — Z202 Contact with and (suspected) exposure to infections with a predominantly sexual mode of transmission: Secondary | ICD-10-CM

## 2023-05-03 DIAGNOSIS — F419 Anxiety disorder, unspecified: Secondary | ICD-10-CM | POA: Diagnosis not present

## 2023-05-03 NOTE — Progress Notes (Signed)
    SUBJECTIVE:   CHIEF COMPLAINT / HPI:   Patient had new sexual partner in October 2024 and would like to be tested for STDs.  She is asymptomatic-no fever, dysuria, vaginal bleeding, pelvic pain, genital sores, rashes. She is currently menstruating. Notes anxiety regarding possibly having HIV.  No known exposures.  States that she did not take her Prozac  for several months and just recently restarted it last week.  PERTINENT  PMH / PSH: Anxiety  OBJECTIVE:   BP 102/72   Pulse 99   Wt 175 lb (79.4 kg)   LMP 05/02/2023   SpO2 98%   BMI 24.41 kg/m   Pelvic - Page Geralyn Knee, RN present. VULVA: normal appearing vulva with no masses, tenderness or lesions  VAGINA: normal appearing vagina with normal color and discharge, no lesions  CERVIX: normal appearing cervix without discharge or lesions, no CMT, Unable to visualize completely given pt is menstruating.   ASSESSMENT/PLAN:   Assessment & Plan Possible exposure to STD GC, trichomonas, HIV, RPR, hepatitis C collected today.  Patient asymptomatic.  Will treat accordingly when test results come back. Anxiety Encouraged to continue 20 mg Prozac  and let us  know in 4 to 6 weeks if it is not working for her, we can possibly adjust the dose at that time.    Sarahann Cumins, DO [facility]

## 2023-05-03 NOTE — Patient Instructions (Signed)
 Good to see you today - Thank you for coming in  Things we discussed today: We have tested you for STDs including HIV.  I will let you know when these results come back. If your Prozac  is still not working for you in about 4 weeks please let us  know. You will be due for your Pap in October.

## 2023-05-04 LAB — RPR: RPR Ser Ql: NONREACTIVE

## 2023-05-04 LAB — HEPATITIS C ANTIBODY: Hep C Virus Ab: NONREACTIVE

## 2023-05-04 LAB — HIV ANTIBODY (ROUTINE TESTING W REFLEX): HIV Screen 4th Generation wRfx: NONREACTIVE

## 2023-05-06 ENCOUNTER — Encounter: Payer: Self-pay | Admitting: Family Medicine

## 2023-05-06 LAB — CERVICOVAGINAL ANCILLARY ONLY
Chlamydia: NEGATIVE
Comment: NEGATIVE
Comment: NEGATIVE
Comment: NORMAL
Neisseria Gonorrhea: NEGATIVE
Trichomonas: NEGATIVE

## 2023-05-07 ENCOUNTER — Encounter: Payer: Self-pay | Admitting: Family Medicine

## 2023-07-23 ENCOUNTER — Ambulatory Visit (INDEPENDENT_AMBULATORY_CARE_PROVIDER_SITE_OTHER): Admitting: Family Medicine

## 2023-07-23 ENCOUNTER — Encounter: Payer: Self-pay | Admitting: Family Medicine

## 2023-07-23 VITALS — BP 110/68 | HR 67 | Ht 71.0 in | Wt 175.0 lb

## 2023-07-23 DIAGNOSIS — R233 Spontaneous ecchymoses: Secondary | ICD-10-CM | POA: Diagnosis not present

## 2023-07-23 NOTE — Patient Instructions (Addendum)
 It was wonderful to see you today.  Please bring ALL of your medications with you to every visit.    Your rash looks consistent with what we call petechiae. This is when little blood vessels can pop due to increased pressure. There is nothing to be concerned about with this and the dots should start to clear on their own as your blood vessels heal. If you experience changes in vision, headaches, worsening swelling, or the rash is not improving in a few days, please call to make an appointment.   Thank you for choosing [facility].   Please call [phone] with any questions about today's appointment.  Please arrive at least 15 minutes prior to your scheduled appointments.   If you had blood work today, I will send you a MyChart message or a letter if results are normal. Otherwise, I will give you a call.   If you had a referral placed, they will call you to set up an appointment. Please give us  a call if you don't hear back in the next 2 weeks.   If you need additional refills before your next appointment, please call your pharmacy first.   You should follow up in our clinic in Return if symptoms worsen or fail to improve.  Gloriann Ogren, MD Family Medicine

## 2023-07-23 NOTE — Progress Notes (Signed)
    SUBJECTIVE:   CHIEF COMPLAINT / HPI:   Patient presents for rash she noted around her eyes this morning. Patient had pretty intense episode of vomiting yesterday from food poisoning. Not painful or itchy. Nothing like this before.   No recent bruising. Patient is currently in a relationship but feels safe. Denies any interpersonal violence. Denies any choking episodes. Denies any recent seizures or seizure disorders. No history of bleeding disorders. No family history of blood disorders.    OBJECTIVE:   BP 110/68   Pulse 67   Ht 5' 11 (1.803 m)   Wt 175 lb (79.4 kg)   LMP 07/22/2023   SpO2 98%   BMI 24.41 kg/m   General: A&O, NAD HEENT: No sign of trauma, EOM grossly intact, PERRLA Respiratory: normal WOB Neuro: No gross neurological deficits  Skin: scattered bilateral periorbital petechiae, not TTP, no bruising or rashes noted   Psych: Appropriate mood and affect   ASSESSMENT/PLAN:   Assessment & Plan Petechiae Consistent with increased pressure 2/2 to food poisoning and vomiting. Patient denies any strangulation event or seizure like activity. Will likely improve in 1-2 weeks. Return precautions given.    Gloriann Ogren, MD [facility]

## 2023-10-05 ENCOUNTER — Ambulatory Visit: Admission: RE | Admit: 2023-10-05 | Discharge: 2023-10-05 | Disposition: A | Discharge status: Home / Self Care

## 2023-10-05 VITALS — BP 110/72 | HR 96 | Temp 98.1°F | Resp 18

## 2023-10-05 DIAGNOSIS — U071 COVID-19: Secondary | ICD-10-CM | POA: Diagnosis not present

## 2023-10-05 DIAGNOSIS — J358 Other chronic diseases of tonsils and adenoids: Secondary | ICD-10-CM | POA: Diagnosis not present

## 2023-10-05 DIAGNOSIS — J029 Acute pharyngitis, unspecified: Secondary | ICD-10-CM | POA: Diagnosis present

## 2023-10-05 DIAGNOSIS — R0989 Other specified symptoms and signs involving the circulatory and respiratory systems: Secondary | ICD-10-CM

## 2023-10-05 LAB — POCT RAPID STREP A (OFFICE): Rapid Strep A Screen: NEGATIVE

## 2023-10-05 LAB — POC SOFIA SARS ANTIGEN FIA: SARS Coronavirus 2 Ag: POSITIVE — AB

## 2023-10-05 NOTE — ED Provider Notes (Signed)
 [facility]    CSN: [id number] Arrival date & time: 10/05/23  1048      History   Chief Complaint Chief Complaint  Patient presents with   Sore Throat    Congestion with sore throat - Entered by patient   Nasal Congestion   Aspiration    HPI Emily Sellers is a 28 y.o. female.  has a past medical history of Anxiety and Depression.   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 No pertinent surgical history.  OB History   No obstetric history on file.      Home Medications    Prior to Admission medications   Medication Sig Start Date End Date Taking? Authorizing Provider  ARIPiprazole (ABILIFY) 5 MG tablet Take 7.5 mg by mouth daily. 09/11/23  Yes [provider]  FLUoxetine  (PROZAC ) 40 MG capsule Take 40 mg by mouth daily. 09/11/23  Yes [provider]  guanFACINE (INTUNIV) 1 MG TB24 ER tablet Take 1 mg by mouth at bedtime. 09/11/23  Yes [provider]  hydrOXYzine (VISTARIL) 25 MG capsule Take 25 mg by mouth daily as needed. 06/10/23  Yes [provider]  FLUoxetine  (PROZAC ) 20 MG tablet TAKE 1 TABLET (20 MG TOTAL) BY MOUTH DAILY. 10/04/22   Cleotilde Perkins, DO  triamcinolone  ointment (KENALOG ) 0.5 % Apply 1 Application topically 2 (two) times daily. 10/16/21   Cleotilde Perkins, DO    Family History Family History   Problem Relation Age of Onset   Healthy Mother    Healthy Father     Social History Social History   Tobacco Use   Smoking status: Never    Passive exposure: Never   Smokeless tobacco: Never  Vaping Use   Vaping status: Never Used  Substance Use Topics   Alcohol use: Yes    Comment: occasionally   Drug use: Yes    Types: Marijuana     Allergies   Patient has no known allergies.   Review of Systems Review of Systems  Constitutional:  Negative for chills and fever.  HENT:  Positive for congestion, rhinorrhea, sinus pressure, sinus pain and sore throat. Negative for ear pain.   Respiratory:  Positive for cough. Negative for shortness of breath and wheezing.   Gastrointestinal:  Negative for diarrhea, nausea and vomiting.  Musculoskeletal:  Positive for myalgias.  Skin:  Negative for rash.  Neurological:  Positive for headaches. Negative for dizziness and light-headedness.     Physical Exam Triage Vital Signs ED Triage Vitals  Encounter Vitals Group     BP 10/05/23 1056 110/72     Girls Systolic BP Percentile --      Girls Diastolic BP Percentile --      Boys Systolic BP Percentile --      Boys Diastolic BP Percentile --      Pulse Rate 10/05/23 1056 96     Resp  10/05/23 1056 18     Temp 10/05/23 1056 98.1 F (36.7 C)     Temp Source 10/05/23 1056 Oral     SpO2 10/05/23 1056 97 %     Weight --      Height --      Head Circumference --      Peak Flow --      Pain Score 10/05/23 1054 5     Pain Loc --      Pain Education --      Exclude from Growth Chart --    No data found.  Updated Vital Signs BP 110/72 (BP Location: Right Arm)   Pulse 96   Temp 98.1 F (36.7 C) (Oral)   Resp 18   LMP 09/14/2023 (Approximate)   SpO2 97%   Visual Acuity Right Eye Distance:   Left Eye Distance:   Bilateral Distance:    Right Eye Near:   Left Eye Near:    Bilateral Near:     Physical Exam Vitals reviewed.  Constitutional:      General: She is awake.      Appearance: Normal appearance. She is well-developed and well-groomed.  HENT:     Head: Normocephalic and atraumatic.     Right Ear: Hearing, tympanic membrane and ear canal normal.     Left Ear: Hearing, tympanic membrane and ear canal normal.     Mouth/Throat:     Lips: Pink.     Mouth: Mucous membranes are moist.     Pharynx: Oropharynx is clear. Uvula midline. No pharyngeal swelling, oropharyngeal exudate, posterior oropharyngeal erythema, uvula swelling or postnasal drip.     Tonsils: Tonsillar exudate present. No tonsillar abscesses. 1+ on the right. 1+ on the left.   Cardiovascular:     Rate and Rhythm: Normal rate and regular rhythm.     Pulses: Normal pulses.          Radial pulses are 2+ on the right side and 2+ on the left side.     Heart sounds: Normal heart sounds. No murmur heard.    No friction rub. No gallop.  Pulmonary:     Effort: Pulmonary effort is normal.     Breath sounds: Normal breath sounds. No decreased air movement. No decreased breath sounds, wheezing, rhonchi or rales.  Musculoskeletal:     Cervical back: Normal range of motion and neck supple.  Lymphadenopathy:     Head:     Right side of head: No submental, submandibular or preauricular adenopathy.     Left side of head: No submental, submandibular or preauricular adenopathy.     Cervical:     Right cervical: No superficial cervical adenopathy.    Left cervical: No superficial cervical adenopathy.     Upper Body:     Right upper body: No supraclavicular adenopathy.     Left upper body: No supraclavicular adenopathy.  Skin:    General: Skin is warm and dry.  Neurological:     General: No focal deficit present.     Mental Status: She is alert and oriented to person, place, and time.  Psychiatric:        Mood and Affect: Mood normal.        Behavior: Behavior normal. Behavior is cooperative.      UC Treatments / Results  Labs (all labs ordered are listed, but only abnormal results are  displayed) Labs Reviewed  POC SOFIA SARS ANTIGEN FIA - Abnormal; Notable for the following components:  Result Value   SARS Coronavirus 2 Ag Positive (*)    All other components within normal limits  CULTURE, GROUP A STREP [facility])  POCT RAPID STREP A (OFFICE)    EKG   Radiology No results found.  Procedures Procedures (including critical care time)  Medications Ordered in UC Medications - No data to display  Initial Impression / Assessment and Plan / UC Course  I have reviewed the triage vital signs and the nursing notes.  Pertinent labs & imaging results that were available during my care of the patient were reviewed by me and considered in my medical decision making (see chart for details).      Final Clinical Impressions(s) / UC Diagnoses   Final diagnoses:  Symptoms of upper respiratory infection (URI)  COVID-19  Tonsillar exudate   Patient presents today with concerns of sore throat, nasal congestion, rhinorrhea, body aches that been ongoing for the past 3 days.  She denies recent sick contacts or travel and has not been taking anything other than ibuprofen  for symptom management.  Physical exam is notable for a small white patch on the right tonsil without significant erythema or swelling.  Rapid strep testing was negative, will get strep culture for definitive rule out.  COVID testing was positive.  Given rather mild symptoms, patient's age and apparent lack of pre-existing conditions that would exacerbate COVID I suspect that she can use OTC medications for symptomatic relief.  The rest of her physical exam and vital signs are reassuring.  ED and return precautions reviewed and provided in AVS.  Follow-up as needed for progressing or persistent symptoms    Discharge Instructions      You were seen today for concerns of sore throat, nasal congestion. Your testing was positive for COVID-19. You are currently on day 3 of symptoms. To help manage your symptoms I  recommend over-the-counter medications such as the following:  DayQuil/NyQuil Alka-Seltzer day and night TheraFlu day and night  *If you have High blood pressure I recommend taking Mucinex , Robitussin, and Acetaminophen rather than the combination medications. Flonase  nasal spray  Nasal saline flushes/rinses Humidifier at night Warm tea with honey Ibuprofen  as needed  Please make sure that you are getting plenty of rest and staying well-hydrated If you develop any of the following symptoms please go to the emergency room: Significant difficulty breathing, chest pain, fevers that are not responding to acetaminophen or ibuprofen , swelling of one of your arms or legs, inability to eat or drink leading to dehydration, confusion, loss of consciousness.      ED Prescriptions   None    PDMP not reviewed this encounter.   Marylene Rocky BRAVO, PA-C 10/05/23 1209

## 2023-10-05 NOTE — ED Triage Notes (Signed)
 Pt reports having congestion, sore throat, and body aches.  Started: 3 days ago  Home interventions: ibuprofen 

## 2023-10-05 NOTE — Discharge Instructions (Addendum)
 You were seen today for concerns of sore throat, nasal congestion. Your testing was positive for COVID-19. You are currently on day 3 of symptoms. To help manage your symptoms I recommend over-the-counter medications such as the following:  DayQuil/NyQuil Alka-Seltzer day and night TheraFlu day and night  *If you have High blood pressure I recommend taking Mucinex , Robitussin, and Acetaminophen rather than the combination medications. Flonase  nasal spray  Nasal saline flushes/rinses Humidifier at night Warm tea with honey Ibuprofen  as needed  Please make sure that you are getting plenty of rest and staying well-hydrated If you develop any of the following symptoms please go to the emergency room: Significant difficulty breathing, chest pain, fevers that are not responding to acetaminophen or ibuprofen , swelling of one of your arms or legs, inability to eat or drink leading to dehydration, confusion, loss of consciousness.

## 2023-10-09 ENCOUNTER — Ambulatory Visit (HOSPITAL_COMMUNITY): Payer: Self-pay

## 2023-10-09 LAB — CULTURE, GROUP A STREP (THRC)

## 2023-11-26 ENCOUNTER — Other Ambulatory Visit (HOSPITAL_COMMUNITY)
Admission: RE | Admit: 2023-11-26 | Discharge: 2023-11-26 | Disposition: A | Source: Ambulatory Visit | Attending: Family Medicine | Admitting: Family Medicine

## 2023-11-26 ENCOUNTER — Ambulatory Visit: Admitting: Student

## 2023-11-26 VITALS — BP 112/57 | HR 69 | Ht 70.0 in | Wt 193.6 lb

## 2023-11-26 DIAGNOSIS — N898 Other specified noninflammatory disorders of vagina: Secondary | ICD-10-CM | POA: Diagnosis not present

## 2023-11-26 DIAGNOSIS — Z124 Encounter for screening for malignant neoplasm of cervix: Secondary | ICD-10-CM | POA: Insufficient documentation

## 2023-11-26 MED ORDER — TRIAMCINOLONE ACETONIDE 0.5 % EX OINT
1.0000 | TOPICAL_OINTMENT | Freq: Two times a day (BID) | CUTANEOUS | 0 refills | Status: AC
Start: 2023-11-26 — End: ?

## 2023-11-26 NOTE — Progress Notes (Unsigned)
    SUBJECTIVE:   CHIEF COMPLAINT / HPI:   Emily Sellers is a 28 y.o. female presenting for pap smear.   She reports doing well. Occasionally she reports having increased irritation on her vulva which steroid cream had previously improved. No pain with intercourse. Previously tested negative for STDs.   PERTINENT  PMH / PSH: reviewed and updated.  OBJECTIVE:   BP (!) 112/57   Pulse 69   Ht 5' 10 (1.778 m)   Wt 193 lb 9.6 oz (87.8 kg)   LMP 11/06/2023   SpO2 100%   BMI 27.78 kg/m   Well-appearing, no acute distress Cardio: Regular rate, regular rhythm, no murmurs on exam. Pulm: Clear, no wheezing, no crackles. 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 Discussed using steroid cream in moderation for flares  Screening for cervical cancer Pap smear completed today      Damien Pinal, DO [facility]

## 2023-11-26 NOTE — Patient Instructions (Signed)
 It was great to see you today!   I have refilled the steroid cream. Try this for the flares and avoid shaving for several weeks to let the irritation resolve. If this does not seem to be improving please let me know.   No future appointments.  Please arrive 15 minutes before your appointment to ensure smooth check in process.  If you are more than 15 minutes late, you may be asked to reschedule.   Please bring a list of your medications with you to all appointments.   Please call the clinic at [phone] if your symptoms worsen or you have any concerns.  Thank you for allowing me to participate in your care, Dr. Damien Pinal [facility]

## 2023-12-02 LAB — CYTOLOGY - PAP: Diagnosis: NEGATIVE

## 2023-12-03 ENCOUNTER — Ambulatory Visit: Payer: Self-pay | Admitting: Student

## 2023-12-03 DIAGNOSIS — B3731 Acute candidiasis of vulva and vagina: Secondary | ICD-10-CM

## 2023-12-03 MED ORDER — FLUCONAZOLE 150 MG PO TABS: 150.0000 mg | ORAL_TABLET | Freq: Once | ORAL | 0 refills | Status: AC
